# Patient Record
Sex: Female | Born: 1975 | Race: Black or African American | Hispanic: No | Marital: Married | Smoking: Never smoker
Health system: Southern US, Community
[De-identification: ages and names within clinical notes are randomized; demographics above are authoritative.]

## PROBLEM LIST (undated history)

## (undated) DIAGNOSIS — F5089 Other specified eating disorder: Secondary | ICD-10-CM

## (undated) DIAGNOSIS — D649 Anemia, unspecified: Secondary | ICD-10-CM

## (undated) DIAGNOSIS — D219 Benign neoplasm of connective and other soft tissue, unspecified: Secondary | ICD-10-CM

## (undated) DIAGNOSIS — F5083 Pica in adults: Secondary | ICD-10-CM

## (undated) DIAGNOSIS — F419 Anxiety disorder, unspecified: Secondary | ICD-10-CM

## (undated) DIAGNOSIS — Z5189 Encounter for other specified aftercare: Secondary | ICD-10-CM

---

## 2006-04-07 DIAGNOSIS — F419 Anxiety disorder, unspecified: Secondary | ICD-10-CM | POA: Insufficient documentation

## 2015-10-27 DIAGNOSIS — F5083 Pica in adults: Secondary | ICD-10-CM | POA: Insufficient documentation

## 2018-01-04 DIAGNOSIS — R1013 Epigastric pain: Secondary | ICD-10-CM | POA: Insufficient documentation

## 2018-03-26 DIAGNOSIS — Z8742 Personal history of other diseases of the female genital tract: Secondary | ICD-10-CM | POA: Insufficient documentation

## 2018-03-26 DIAGNOSIS — D251 Intramural leiomyoma of uterus: Secondary | ICD-10-CM | POA: Insufficient documentation

## 2020-06-01 ENCOUNTER — Emergency Department (HOSPITAL_COMMUNITY): Payer: BLUE CROSS/BLUE SHIELD

## 2020-06-01 ENCOUNTER — Encounter (HOSPITAL_COMMUNITY): Payer: Self-pay | Admitting: Emergency Medicine

## 2020-06-01 ENCOUNTER — Other Ambulatory Visit: Payer: Self-pay

## 2020-06-01 ENCOUNTER — Emergency Department (HOSPITAL_COMMUNITY)
Admission: EM | Admit: 2020-06-01 | Discharge: 2020-06-01 | Disposition: A | Discharge status: Home / Self Care | Payer: BLUE CROSS/BLUE SHIELD | Attending: Emergency Medicine | Admitting: Emergency Medicine

## 2020-06-01 DIAGNOSIS — Z20822 Contact with and (suspected) exposure to covid-19: Secondary | ICD-10-CM | POA: Insufficient documentation

## 2020-06-01 DIAGNOSIS — K5900 Constipation, unspecified: Secondary | ICD-10-CM | POA: Diagnosis not present

## 2020-06-01 DIAGNOSIS — N920 Excessive and frequent menstruation with regular cycle: Secondary | ICD-10-CM | POA: Diagnosis not present

## 2020-06-01 DIAGNOSIS — N946 Dysmenorrhea, unspecified: Secondary | ICD-10-CM | POA: Insufficient documentation

## 2020-06-01 DIAGNOSIS — D649 Anemia, unspecified: Secondary | ICD-10-CM | POA: Diagnosis not present

## 2020-06-01 DIAGNOSIS — Z5329 Procedure and treatment not carried out because of patient's decision for other reasons: Secondary | ICD-10-CM

## 2020-06-01 DIAGNOSIS — R42 Dizziness and giddiness: Secondary | ICD-10-CM | POA: Diagnosis present

## 2020-06-01 HISTORY — DX: Pica in adults: F50.83

## 2020-06-01 HISTORY — DX: Other specified eating disorder: F50.89

## 2020-06-01 HISTORY — DX: Anemia, unspecified: D64.9

## 2020-06-01 HISTORY — DX: Benign neoplasm of connective and other soft tissue, unspecified: D21.9

## 2020-06-01 LAB — HEMOGLOBIN AND HEMATOCRIT, BLOOD
HCT: 24.2 % — ABNORMAL LOW (ref 36.0–46.0)
Hemoglobin: 6.3 g/dL — CL (ref 12.0–15.0)

## 2020-06-01 LAB — CBC
HCT: 23.9 % — ABNORMAL LOW (ref 36.0–46.0)
Hemoglobin: 6.4 g/dL — CL (ref 12.0–15.0)
MCH: 18 pg — ABNORMAL LOW (ref 26.0–34.0)
MCHC: 26.8 g/dL — ABNORMAL LOW (ref 30.0–36.0)
MCV: 67.3 fL — ABNORMAL LOW (ref 80.0–100.0)
Platelets: 219 10*3/uL (ref 150–400)
RBC: 3.55 MIL/uL — ABNORMAL LOW (ref 3.87–5.11)
RDW: 20.5 % — ABNORMAL HIGH (ref 11.5–15.5)
WBC: 4.4 10*3/uL (ref 4.0–10.5)
nRBC: 0 % (ref 0.0–0.2)

## 2020-06-01 LAB — IRON AND TIBC
Iron: 16 ug/dL — ABNORMAL LOW (ref 28–170)
Saturation Ratios: 3 % — ABNORMAL LOW (ref 10.4–31.8)
TIBC: 496 ug/dL — ABNORMAL HIGH (ref 250–450)
UIBC: 480 ug/dL

## 2020-06-01 LAB — VITAMIN B12: Vitamin B-12: 547 pg/mL (ref 180–914)

## 2020-06-01 LAB — I-STAT BETA HCG BLOOD, ED (MC, WL, AP ONLY): I-stat hCG, quantitative: <5 m[IU]/mL (ref <5)

## 2020-06-01 LAB — FERRITIN: Ferritin: 2 ng/mL — ABNORMAL LOW (ref 11–307)

## 2020-06-01 LAB — COMPREHENSIVE METABOLIC PANEL
ALT: 11 U/L (ref 0–44)
AST: 12 U/L — ABNORMAL LOW (ref 15–41)
Albumin: 3.8 g/dL (ref 3.5–5.0)
Alkaline Phosphatase: 62 U/L (ref 38–126)
Anion gap: 8 (ref 5–15)
BUN: 11 mg/dL (ref 6–20)
CO2: 23 mmol/L (ref 22–32)
Calcium: 8.6 mg/dL — ABNORMAL LOW (ref 8.9–10.3)
Chloride: 107 mmol/L (ref 98–111)
Creatinine, Ser: 0.7 mg/dL (ref 0.44–1.00)
GFR, Estimated: >60 mL/min (ref >60)
Glucose, Bld: 103 mg/dL — ABNORMAL HIGH (ref 70–99)
Potassium: 3.7 mmol/L (ref 3.5–5.1)
Sodium: 138 mmol/L (ref 135–145)
Total Bilirubin: 0.4 mg/dL (ref 0.3–1.2)
Total Protein: 7.4 g/dL (ref 6.5–8.1)

## 2020-06-01 LAB — TYPE AND SCREEN
ABO/RH(D): O POS
Antibody Screen: NEGATIVE

## 2020-06-01 LAB — RETICULOCYTES
Immature Retic Fract: 17.8 % — ABNORMAL HIGH (ref 2.3–15.9)
RBC.: 3.56 MIL/uL — ABNORMAL LOW (ref 3.87–5.11)
Retic Count, Absolute: 38.1 10*3/uL (ref 19.0–186.0)
Retic Ct Pct: 1.1 % (ref 0.4–3.1)

## 2020-06-01 LAB — TROPONIN I (HIGH SENSITIVITY)
Troponin I (High Sensitivity): <2 ng/L (ref <18)
Troponin I (High Sensitivity): <2 ng/L (ref <18)

## 2020-06-01 LAB — LIPASE, BLOOD: Lipase: 30 U/L (ref 11–51)

## 2020-06-01 LAB — FOLATE: Folate: 6.2 ng/mL (ref >5.9)

## 2020-06-01 LAB — RESP PANEL BY RT-PCR (FLU A&B, COVID) ARPGX2
Influenza A by PCR: NEGATIVE
Influenza B by PCR: NEGATIVE
SARS Coronavirus 2 by RT PCR: NEGATIVE

## 2020-06-01 LAB — POC OCCULT BLOOD, ED: Fecal Occult Bld: POSITIVE — AB

## 2020-06-01 LAB — ABO/RH: ABO/RH(D): O POS

## 2020-06-01 MED ORDER — SODIUM CHLORIDE 0.9 % IV BOLUS
1000.0000 mL | Freq: Once | INTRAVENOUS | Status: AC
  Administered 2020-06-01: 1000 mL via INTRAVENOUS

## 2020-06-01 MED ORDER — ALUM & MAG HYDROXIDE-SIMETH 200-200-20 MG/5ML PO SUSP
30.0000 mL | Freq: Once | ORAL | Status: AC
  Administered 2020-06-01: 30 mL via ORAL
  Filled 2020-06-01: qty 30

## 2020-06-01 NOTE — ED Notes (Signed)
Patient educated on her need for a blood transfusion, she still does not want to stay.  Patient states her hgb is always low and she will be fine.  Daughter also at bedside attempting to get her mother to stay and she still wants to leave.  Patient's IV removed at her request and she signed AMA paper.

## 2020-06-01 NOTE — ED Notes (Signed)
Patient has pulled off BP cuff and pulse ox cord.  Stating she wants to leave and does not want to stay for blood.  Benjamine Mola, [place] made aware.

## 2020-06-01 NOTE — ED Notes (Signed)
Critical lab result: Hgb 6.3; notified Andee Poles, RN and [place], [place]

## 2020-06-01 NOTE — Discharge Instructions (Addendum)
Today you left against medical advice.  You left before I could give you a referral for hematology.

## 2020-06-01 NOTE — ED Provider Notes (Signed)
[facility] DEPT Provider Note   CSN: [id number] Arrival date & time: 06/01/20  1256     History No chief complaint on file.   Angel Munoz is a 44 y.o. female the past medical history of anemia, anxiety, fibroids, pica, who presents today for evaluation of pains since Saturday.  She reports that since Saturday she has had mid abdominal pain that radiates up into her chest.  She also endorses that since yesterday into today she has been lightheaded and dizzy especially with position changes.  She denies any loss of consciousness or falls.  No nausea vomiting or diarrhea however she does have constipation.  She denies significant cough or shortness of breath.  No leg swelling, hemoptysis, recent surgeries or immobilizations.  No personal history of DVT/PE.  She denies any fevers.  No recent sickness.  She states that when she was in [place] she used to get iron infusions and has never required blood transfusion before however has not recently had iron transfusion.  HPI     Past Medical History:  Diagnosis Date  . Anemia   . Fibroid   . Pica in adults     There are no problems to display for this patient.   History reviewed. No pertinent surgical history.   OB History   No obstetric history on file.     No family history on file.  Social History   Tobacco Use  . Smoking status: Never Smoker  . Smokeless tobacco: Never Used  Substance Use Topics  . Alcohol use: Not Currently  . Drug use: Not Currently    Home Medications Prior to Admission medications   Medication Sig Start Date End Date Taking? Authorizing Provider  albuterol (VENTOLIN HFA) 108 (90 Base) MCG/ACT inhaler Inhale 1 puff into the lungs every 6 (six) hours as needed for wheezing or shortness of breath.  05/07/20  Yes [provider]  omeprazole (PRILOSEC) 20 MG capsule Take 20 mg by mouth daily as needed (acid reflux).  05/07/20  Yes [provider]    Allergies    Iron dextran  Review of Systems   Review of Systems  Constitutional: Positive for fatigue.  Respiratory: Negative for cough, chest tightness and shortness of breath.   Cardiovascular: Positive for chest pain.  Gastrointestinal: Positive for abdominal pain and constipation. Negative for blood in stool and vomiting.  Genitourinary: Negative for difficulty urinating and flank pain.  Musculoskeletal: Negative for back pain.  Skin: Negative for rash.  Neurological: Positive for light-headedness. Negative for syncope and weakness.  Psychiatric/Behavioral: Negative for confusion.  All other systems reviewed and are negative.   Physical Exam Updated Vital Signs BP 122/70   Pulse 79   Temp 98.1 F (36.7 C)   Resp (!) 21   Ht 5\' 7"  (1.702 m)   Wt 81.6 kg   LMP 06/01/2020   SpO2 100%   BMI 28.19 kg/m   Physical Exam Vitals and nursing note reviewed. Exam conducted with a chaperone present.  Constitutional:      General: She is not in acute distress.    Appearance: She is well-developed. She is not diaphoretic.  HENT:     Head: Normocephalic and atraumatic.  Eyes:     General: No scleral icterus.       Right eye: No discharge.        Left eye: No discharge.  Cardiovascular:     Rate and Rhythm: Normal rate and regular rhythm.  Heart sounds: Normal heart sounds. No murmur heard.   Pulmonary:     Effort: Pulmonary effort is normal. No respiratory distress.     Breath sounds: Normal breath sounds. No stridor.  Abdominal:     General: Abdomen is flat. There is no distension.     Palpations: Abdomen is soft.     Tenderness: There is abdominal tenderness (Mild, epigastric). There is no right CVA tenderness, left CVA tenderness or guarding.  Genitourinary:    Rectum: Guaiac result positive (No stool in rectal vault.  Due to menstrual cycle the rectum was cleaned with cloth to remove menstrual blood from contaminating sample. ).  Musculoskeletal:         General: No deformity.     Cervical back: Normal range of motion and neck supple.  Skin:    General: Skin is warm and dry.  Neurological:     General: No focal deficit present.     Mental Status: She is alert and oriented to person, place, and time.     Motor: No abnormal muscle tone.  Psychiatric:        Mood and Affect: Mood normal.        Behavior: Behavior normal.     ED Results / Procedures / Treatments   Labs (all labs ordered are listed, but only abnormal results are displayed) Labs Reviewed  COMPREHENSIVE METABOLIC PANEL - Abnormal; Notable for the following components:      Result Value   Glucose, Bld 103 (*)    Calcium 8.6 (*)    AST 12 (*)    All other components within normal limits  CBC - Abnormal; Notable for the following components:   RBC 3.55 (*)    Hemoglobin 6.4 (*)    HCT 23.9 (*)    MCV 67.3 (*)    MCH 18.0 (*)    MCHC 26.8 (*)    RDW 20.5 (*)    All other components within normal limits  IRON AND TIBC - Abnormal; Notable for the following components:   Iron 16 (*)    TIBC 496 (*)    Saturation Ratios 3 (*)    All other components within normal limits  FERRITIN - Abnormal; Notable for the following components:   Ferritin 2 (*)    All other components within normal limits  RETICULOCYTES - Abnormal; Notable for the following components:   RBC. 3.56 (*)    Immature Retic Fract 17.8 (*)    All other components within normal limits  HEMOGLOBIN AND HEMATOCRIT, BLOOD - Abnormal; Notable for the following components:   Hemoglobin 6.3 (*)    HCT 24.2 (*)    All other components within normal limits  POC OCCULT BLOOD, ED - Abnormal; Notable for the following components:   Fecal Occult Bld POSITIVE (*)    All other components within normal limits  RESP PANEL BY RT-PCR (FLU A&B, COVID) ARPGX2  LIPASE, BLOOD  VITAMIN B12  FOLATE  URINALYSIS, ROUTINE W REFLEX MICROSCOPIC  I-STAT BETA HCG BLOOD, ED ([facility], [facility], [facility] ONLY)  TYPE AND SCREEN  ABO/RH  TROPONIN  I (HIGH SENSITIVITY)  TROPONIN I (HIGH SENSITIVITY)    EKG None  Radiology DG Chest 2 View  Result Date: 06/01/2020 CLINICAL DATA:  Chest pain. EXAM: CHEST - 2 VIEW COMPARISON:  None. FINDINGS: The heart size and mediastinal contours are within normal limits. Both lungs are clear. No visible pleural effusions or pneumothorax. The visualized skeletal structures are unremarkable. IMPRESSION: No active cardiopulmonary disease.  Electronically Signed   By: Margaretha Sheffield MD   On: 06/01/2020 14:15    Procedures Procedures (including critical care time)  Medications Ordered in ED Medications  sodium chloride 0.9 % bolus 1,000 mL (0 mLs Intravenous Stopped 06/01/20 1749)  alum & mag hydroxide-simeth (MAALOX/MYLANTA) 200-200-20 MG/5ML suspension 30 mL (30 mLs Oral Given 06/01/20 1748)    ED Course  I have reviewed the triage vital signs and the nursing notes.  Pertinent labs & imaging results that were available during my care of the patient were reviewed by me and considered in my medical decision making (see chart for details).  Clinical Course as of Jun 01 2233  Mon Jun 01, 2020  1620 Repeat H and H confirms anemia.  Patient at this time feels like her symptoms of being dizzy are due to her being dehydrated, not to her hemoglobin of 6.3.  She does not wish for blood transfusion at this time.  She wishes for trial of IV fluids.  We discussed that this will not fix her anemia and may take it temporarily appear worse.  She states her understanding.  She also states that she will not, under any circumstances, be admitted into the hospital.   [EH]    Clinical Course User Index [EH] Ollen Gross   MDM Rules/Calculators/A&P                         Yulisa Chirico is a 44 year old woman with a past medical history of anemia who presents today for evaluation of multiple complaints. She has had abdominal pain radiating into her chest with lightheaded and dizziness. She  is chronically constipated. She has chronic abdominal pain. Chart review from records from [place] show she has had multiple imaging for what appears to be similar events. She used to get iron infusions however has not had any recently. Here today she is anemic. Initial hemoglobin is 6.4, repeat confirmation is 6.3. I recommended blood transfusion. I discussed with patient that while iron infusions can help increase her hemoglobin over time that with her symptoms and the degree of her anemia I would recommend blood transfusion rather than attempting iron transfusions. Anemia panel is sent. She does not have significant leukocytosis and her CMP is unremarkable, lipase is not elevated and pregnancy test is negative. She does report heavy menstrual cycles which may be, when combined with her iron deficiency a cause of her worsening anemia she also had epigastric abdominal pain. She has previously been seen by GI however does not have a local GI currently. Occult blood was performed which was positive raising concern for a possibly bleeding gastric ulcer.  I additionally recommended that patient be admitted to the hospital given her Hemoccult positive and anemia for GI evaluation. Patient is adamant that she will not be staying in the hospital and will not be admitted.  Anemia panel shows low iron at 16, reticulocytes are elevated at 17.8 with low ferritin. Folate is normal. Troponin x2 is negative, EKG does not show ischemia, she is PERC negative and chest x-ray is reassuring.  Patient refused blood transfusion stating she believed her symptoms are due to dehydration which she states is related to her constipation. She wished to try treatment with IV fluids and then depending on how she felt be reevaluated for blood transfusion. I discussed that this would create a delusional effect worsening her anemia and possibly worsening her symptoms. We discussed risks of  untreated anemia including organ damage, syncope with  resulting trauma, disability, and side effects up to and including death.   Patient chose to leave. She did this and did not give me adequate time to see her again and reiterate risks of her decision.   Patient signed AMA papers according to RN. RN note additionally reports they educated patient on need for blood transfusion and patient still chose to leave.  Patient appears to have the ability to make her own medical decisions at this time.   Note: Portions of this report may have been transcribed using voice recognition software. Every effort was made to ensure accuracy; however, inadvertent computerized transcription errors may be present  Final Clinical Impression(s) / ED Diagnoses Final diagnoses:  Anemia, unspecified type  Left against medical advice  Menorrhagia with regular cycle    Rx / DC Orders ED Discharge Orders    None       Ollen Gross 06/01/20 2234    Lajean Saver, MD 06/02/20 [phone]

## 2020-06-01 NOTE — ED Triage Notes (Signed)
Patient states that she has intermittent mid-back pain that moves to her abdomen, and up to her chest since Saturday. Endorses light headness and dizziness, denies LOC or falls. Denies N/V/D, endorses mild constipation. Denies cough.

## 2020-06-23 ENCOUNTER — Ambulatory Visit: Payer: BLUE CROSS/BLUE SHIELD | Admitting: Nurse Practitioner

## 2020-06-23 ENCOUNTER — Ambulatory Visit (HOSPITAL_COMMUNITY): Admit: 2020-06-23 | Discharge status: Against Medical Advice | Payer: BLUE CROSS/BLUE SHIELD

## 2020-06-23 NOTE — Progress Notes (Deleted)
06/23/2020 Angel Munoz [id number] Jun 14, 1976   CHIEF COMPLAINT:   HISTORY OF PRESENT ILLNESS:  Angel Munoz is a 44 year old female with a past medical history of anemia, uterine fibroids   She presented to the ED on 06/01/2020 with indigestion described as pain from the xyphoid which radiated up into her chest. Mid abdominal pain. Light headedness and dizziness. She declined a blood transfusion and hospital admission.    She states that when she was in [place] she used to get iron infusions and has never required blood transfusion before however has not recently had iron transfusion.  CBC Latest Ref Rng & Units 06/01/2020 06/01/2020  WBC 4.0 - 10.5 K/uL - 4.4  Hemoglobin 12.0 - 15.0 g/dL 6.3(LL) 6.4(LL)  Hematocrit 36.0 - 46.0 % 24.2(L) 23.9(L)  Platelets 150 - 400 K/uL - 219    Iron/TIBC/Ferritin/ %Sat    Component Value Date/Time   IRON 16 (L) 06/01/2020 1503   TIBC 496 (H) 06/01/2020 1503   FERRITIN 2 (L) 06/01/2020 1503   IRONPCTSAT 3 (L) 06/01/2020 1503   CMP Latest Ref Rng & Units 06/01/2020  Glucose 70 - 99 mg/dL 103(H)  BUN 6 - 20 mg/dL 11  Creatinine 0.44 - 1.00 mg/dL 0.70  Sodium 135 - 145 mmol/L 138  Potassium 3.5 - 5.1 mmol/L 3.7  Chloride 98 - 111 mmol/L 107  CO2 22 - 32 mmol/L 23  Calcium 8.9 - 10.3 mg/dL 8.6(L)  Total Protein 6.5 - 8.1 g/dL 7.4  Total Bilirubin 0.3 - 1.2 mg/dL 0.4  Alkaline Phos 38 - 126 U/L 62  AST 15 - 41 U/L 12(L)  ALT 0 - 44 U/L 11   Past Medical History:  Diagnosis Date  . Anemia   . Anemia   . Fibroid   . Pica in adults    Social History:  Family History:    No past surgical history on file.  reports that she has never smoked. She has never used smokeless tobacco. She reports previous alcohol use. She reports previous drug use. family history is not on file. Allergies  Allergen Reactions  . Iron Dextran     Other reaction(s): Musculoskeletal Pain Fever, cough      Outpatient Encounter  Medications as of 06/23/2020  Medication Sig  . albuterol (VENTOLIN HFA) 108 (90 Base) MCG/ACT inhaler Inhale 1 puff into the lungs every 6 (six) hours as needed for wheezing or shortness of breath.   Marland Kitchen omeprazole (PRILOSEC) 20 MG capsule Take 20 mg by mouth daily as needed (acid reflux).    No facility-administered encounter medications on file as of 06/23/2020.     REVIEW OF SYSTEMS:  Gen: Denies fever, sweats or chills. No weight loss.  CV: Denies chest pain, palpitations or edema. Resp: Denies cough, shortness of breath of hemoptysis.  GI: Denies heartburn, dysphagia, stomach or lower abdominal pain. No diarrhea or constipation.  GU : Denies urinary burning, blood in urine, increased urinary frequency or incontinence. MS: Denies joint pain, muscles aches or weakness. Derm: Denies rash, itchiness, skin lesions or unhealing ulcers. Psych: Denies depression, anxiety, memory loss, suicidal ideation and confusion. Heme: Denies bruising, bleeding. Neuro:  Denies headaches, dizziness or paresthesias. Endo:  Denies any problems with DM, thyroid or adrenal function.    PHYSICAL EXAM: LMP 06/01/2020  General: Well developed ... in no acute distress. Head: Normocephalic and atraumatic. Eyes:  Sclerae non-icteric, conjunctive pink. Ears: Normal auditory acuity. Mouth: Dentition intact. No ulcers or lesions.  Neck: Supple, no lymphadenopathy or thyromegaly.  Lungs: Clear bilaterally to auscultation without wheezes, crackles or rhonchi. Heart: Regular rate and rhythm. No murmur, rub or gallop appreciated.  Abdomen: Soft, nontender, non distended. No masses. No hepatosplenomegaly. Normoactive bowel sounds x 4 quadrants.  Rectal:  Musculoskeletal: Symmetrical with no gross deformities. Skin: Warm and dry. No rash or lesions on visible extremities. Extremities: No edema. Neurological: Alert oriented x 4, no focal deficits.  Psychological:  Alert and cooperative. Normal mood and  affect.  ASSESSMENT AND PLAN:    CC:  No ref. provider found

## 2020-06-24 ENCOUNTER — Other Ambulatory Visit: Payer: Self-pay

## 2020-06-24 ENCOUNTER — Emergency Department (HOSPITAL_BASED_OUTPATIENT_CLINIC_OR_DEPARTMENT_OTHER)
Admission: EM | Admit: 2020-06-24 | Discharge: 2020-06-24 | Disposition: A | Discharge status: Home / Self Care | Payer: BLUE CROSS/BLUE SHIELD | Attending: Emergency Medicine | Admitting: Emergency Medicine

## 2020-06-24 ENCOUNTER — Encounter (HOSPITAL_BASED_OUTPATIENT_CLINIC_OR_DEPARTMENT_OTHER): Payer: Self-pay | Admitting: Emergency Medicine

## 2020-06-24 DIAGNOSIS — R5383 Other fatigue: Secondary | ICD-10-CM | POA: Insufficient documentation

## 2020-06-24 LAB — CBC WITH DIFFERENTIAL/PLATELET
Abs Immature Granulocytes: 0 10*3/uL (ref 0.00–0.07)
Basophils Absolute: 0 10*3/uL (ref 0.0–0.1)
Basophils Relative: 0 %
Eosinophils Absolute: 0 10*3/uL (ref 0.0–0.5)
Eosinophils Relative: 0 %
HCT: 37.9 % (ref 36.0–46.0)
Hemoglobin: 11.1 g/dL — ABNORMAL LOW (ref 12.0–15.0)
Lymphocytes Relative: 37 %
Lymphs Abs: 1.9 10*3/uL (ref 0.7–4.0)
MCH: 22.4 pg — ABNORMAL LOW (ref 26.0–34.0)
MCHC: 29.3 g/dL — ABNORMAL LOW (ref 30.0–36.0)
MCV: 76.6 fL — ABNORMAL LOW (ref 80.0–100.0)
Monocytes Absolute: 0.4 10*3/uL (ref 0.1–1.0)
Monocytes Relative: 7 %
Neutro Abs: 2.8 10*3/uL (ref 1.7–7.7)
Neutrophils Relative %: 56 %
Platelets: 278 10*3/uL (ref 150–400)
RBC: 4.95 MIL/uL (ref 3.87–5.11)
RDW: 29.1 % — ABNORMAL HIGH (ref 11.5–15.5)
WBC: 5 10*3/uL (ref 4.0–10.5)
nRBC: 0 % (ref 0.0–0.2)

## 2020-06-24 LAB — BASIC METABOLIC PANEL
Anion gap: 9 (ref 5–15)
BUN: 10 mg/dL (ref 6–20)
CO2: 23 mmol/L (ref 22–32)
Calcium: 9.4 mg/dL (ref 8.9–10.3)
Chloride: 103 mmol/L (ref 98–111)
Creatinine, Ser: 0.75 mg/dL (ref 0.44–1.00)
GFR, Estimated: >60 mL/min (ref >60)
Glucose, Bld: 94 mg/dL (ref 70–99)
Potassium: 3.4 mmol/L — ABNORMAL LOW (ref 3.5–5.1)
Sodium: 135 mmol/L (ref 135–145)

## 2020-06-24 NOTE — Discharge Instructions (Addendum)
Please make sure to establish with a primary care doctor and OB/GYN.  Return to the ER for any new or worsening symptoms.

## 2020-06-24 NOTE — ED Provider Notes (Signed)
[facility] Provider Note   CSN: [id number] Arrival date & time: 06/24/20  1402     History Chief Complaint  Patient presents with   Anemia    Amarilis Ismael Treptow is a 44 y.o. female.  HPI 44 year old female with history of anemia, fibroids, pica presents to the ER requesting her hemoglobin be checked as she has been feeling more tired and winded over the last 2 days.  Has a history of anemia secondary to heavy periods.  States that she was seen here several weeks ago and she had a hemoglobin of 6, was told that she needs a transfusion, however she left [place] as she was traveling to [place] the next week and generally gets her transfusions done there.  She states that she did get these transfusions done in [place], was also put on some OCPs.  Denies any fevers, chills, cough, current vaginal bleeding or discharge, abdominal pain, nasal congestion or any other infectious symptoms.    Past Medical History:  Diagnosis Date   Anemia    Anemia    Fibroid    Pica in adults     There are no problems to display for this patient.   History reviewed. No pertinent surgical history.   OB History   No obstetric history on file.     No family history on file.  Social History   Tobacco Use   Smoking status: Never Smoker   Smokeless tobacco: Never Used  Substance Use Topics   Alcohol use: Not Currently   Drug use: Not Currently    Home Medications Prior to Admission medications   Medication Sig Start Date End Date Taking? Authorizing Provider  albuterol (VENTOLIN HFA) 108 (90 Base) MCG/ACT inhaler Inhale 1 puff into the lungs every 6 (six) hours as needed for wheezing or shortness of breath.  05/07/20   [provider]  omeprazole (PRILOSEC) 20 MG capsule Take 20 mg by mouth daily as needed (acid reflux).  05/07/20   [provider]    Allergies    Iron dextran  Review of Systems   Review of  Systems  Constitutional: Positive for fatigue. Negative for chills and fever.  HENT: Negative for ear pain and sore throat.   Eyes: Negative for pain and visual disturbance.  Respiratory: Negative for cough and shortness of breath.   Cardiovascular: Negative for chest pain and palpitations.  Gastrointestinal: Negative for abdominal pain and vomiting.  Genitourinary: Negative for dysuria and hematuria.  Musculoskeletal: Negative for arthralgias and back pain.  Skin: Negative for color change and rash.  Neurological: Negative for seizures and syncope.  All other systems reviewed and are negative.   Physical Exam Updated Vital Signs BP 122/87 (BP Location: Left Arm)    Pulse 66    Temp 98.8 F (37.1 C) (Oral)    LMP 06/01/2020    SpO2 97%   Physical Exam Vitals and nursing note reviewed.  Constitutional:      General: She is not in acute distress.    Appearance: She is well-developed and well-nourished.  HENT:     Head: Normocephalic and atraumatic.  Eyes:     Conjunctiva/sclera: Conjunctivae normal.  Cardiovascular:     Rate and Rhythm: Normal rate and regular rhythm.     Heart sounds: No murmur heard.   Pulmonary:     Effort: Pulmonary effort is normal. No respiratory distress.     Breath sounds: Normal breath sounds.  Abdominal:  Palpations: Abdomen is soft.     Tenderness: There is no abdominal tenderness.  Musculoskeletal:        General: No edema.     Cervical back: Neck supple.  Skin:    General: Skin is warm and dry.  Neurological:     Mental Status: She is alert.  Psychiatric:        Mood and Affect: Mood and affect normal.     ED Results / Procedures / Treatments   Labs (all labs ordered are listed, but only abnormal results are displayed) Labs Reviewed  CBC WITH DIFFERENTIAL/PLATELET - Abnormal; Notable for the following components:      Result Value   Hemoglobin 11.1 (*)    MCV 76.6 (*)    MCH 22.4 (*)    MCHC 29.3 (*)    RDW 29.1 (*)    All  other components within normal limits  BASIC METABOLIC PANEL - Abnormal; Notable for the following components:   Potassium 3.4 (*)    All other components within normal limits    EKG None  Radiology No results found.  Procedures Procedures (including critical care time)  Medications Ordered in ED Medications - No data to display  ED Course  I have reviewed the triage vital signs and the nursing notes.  Pertinent labs & imaging results that were available during my care of the patient were reviewed by me and considered in my medical decision making (see chart for details).    MDM Rules/Calculators/A&P                         44 year old female complaining of fatigue.  Not currently bleeding, no vaginal complaints, do not think there is a need for pelvic exam right now.  Her CBC shows a hemoglobin of 11.1 today.  She denies any other infectious symptoms.  Low suspicion for infection has been a cause of her symptoms.  BMP with mild hypokalemia but no other significant findings.  She thinks that her fatigue is probably due to her sleep schedule.  Patient was encouraged to take over-the-counter potassium supplements.  She does not have an OB/GYN or PCP in the area, however states she is recently insured and is a process of looking one.  I directed her to the phone number in her discharge paperwork in order to establish with 1.  Return precautions discussed.  She voiced understanding and is agreeable.  Final Clinical Impression(s) / ED Diagnoses Final diagnoses:  Fatigue, unspecified type    Rx / DC Orders ED Discharge Orders    None       Garald Balding, PA-C 06/24/20 [place], [place], DO 06/25/20 [phone]

## 2020-06-24 NOTE — ED Triage Notes (Signed)
States she has anemia and that she was seen at [facility] before Thx and they wanted to admit her then but she was going to [place] and was transfused there instead , now she is feel ing fatiqued again and wants to see if her HGB is low again states just moved here 4 months ago

## 2020-08-28 DIAGNOSIS — N920 Excessive and frequent menstruation with regular cycle: Secondary | ICD-10-CM | POA: Insufficient documentation

## 2020-09-05 ENCOUNTER — Other Ambulatory Visit: Payer: Self-pay

## 2020-09-05 ENCOUNTER — Emergency Department (HOSPITAL_BASED_OUTPATIENT_CLINIC_OR_DEPARTMENT_OTHER): Payer: 59

## 2020-09-05 ENCOUNTER — Encounter (HOSPITAL_BASED_OUTPATIENT_CLINIC_OR_DEPARTMENT_OTHER): Payer: Self-pay | Admitting: *Deleted

## 2020-09-05 ENCOUNTER — Emergency Department (HOSPITAL_BASED_OUTPATIENT_CLINIC_OR_DEPARTMENT_OTHER)
Admission: EM | Admit: 2020-09-05 | Discharge: 2020-09-05 | Disposition: A | Discharge status: Home / Self Care | Payer: 59 | Attending: Emergency Medicine | Admitting: Emergency Medicine

## 2020-09-05 DIAGNOSIS — D259 Leiomyoma of uterus, unspecified: Secondary | ICD-10-CM | POA: Diagnosis not present

## 2020-09-05 DIAGNOSIS — N281 Cyst of kidney, acquired: Secondary | ICD-10-CM | POA: Insufficient documentation

## 2020-09-05 DIAGNOSIS — K5900 Constipation, unspecified: Secondary | ICD-10-CM | POA: Diagnosis not present

## 2020-09-05 DIAGNOSIS — N83202 Unspecified ovarian cyst, left side: Secondary | ICD-10-CM | POA: Insufficient documentation

## 2020-09-05 DIAGNOSIS — R1032 Left lower quadrant pain: Secondary | ICD-10-CM

## 2020-09-05 DIAGNOSIS — K7689 Other specified diseases of liver: Secondary | ICD-10-CM | POA: Diagnosis not present

## 2020-09-05 HISTORY — DX: Encounter for other specified aftercare: Z51.89

## 2020-09-05 LAB — COMPREHENSIVE METABOLIC PANEL
ALT: 11 U/L (ref 0–44)
AST: 15 U/L (ref 15–41)
Albumin: 3.5 g/dL (ref 3.5–5.0)
Alkaline Phosphatase: 49 U/L (ref 38–126)
Anion gap: 7 (ref 5–15)
BUN: 13 mg/dL (ref 6–20)
CO2: 22 mmol/L (ref 22–32)
Calcium: 8.8 mg/dL — ABNORMAL LOW (ref 8.9–10.3)
Chloride: 107 mmol/L (ref 98–111)
Creatinine, Ser: 0.85 mg/dL (ref 0.44–1.00)
GFR, Estimated: >60 mL/min (ref >60)
Glucose, Bld: 103 mg/dL — ABNORMAL HIGH (ref 70–99)
Potassium: 3.2 mmol/L — ABNORMAL LOW (ref 3.5–5.1)
Sodium: 136 mmol/L (ref 135–145)
Total Bilirubin: 0.1 mg/dL — ABNORMAL LOW (ref 0.3–1.2)
Total Protein: 6.9 g/dL (ref 6.5–8.1)

## 2020-09-05 LAB — CBC
HCT: 30.5 % — ABNORMAL LOW (ref 36.0–46.0)
Hemoglobin: 9.4 g/dL — ABNORMAL LOW (ref 12.0–15.0)
MCH: 26.9 pg (ref 26.0–34.0)
MCHC: 30.8 g/dL (ref 30.0–36.0)
MCV: 87.1 fL (ref 80.0–100.0)
Platelets: 237 10*3/uL (ref 150–400)
RBC: 3.5 MIL/uL — ABNORMAL LOW (ref 3.87–5.11)
RDW: 17.2 % — ABNORMAL HIGH (ref 11.5–15.5)
WBC: 4.7 10*3/uL (ref 4.0–10.5)
nRBC: 0 % (ref 0.0–0.2)

## 2020-09-05 LAB — URINALYSIS, ROUTINE W REFLEX MICROSCOPIC
Bilirubin Urine: NEGATIVE
Glucose, UA: NEGATIVE mg/dL
Hgb urine dipstick: NEGATIVE
Ketones, ur: NEGATIVE mg/dL
Leukocytes,Ua: NEGATIVE
Nitrite: NEGATIVE
Protein, ur: NEGATIVE mg/dL
Specific Gravity, Urine: 1.015 (ref 1.005–1.030)
pH: 6.5 (ref 5.0–8.0)

## 2020-09-05 LAB — LIPASE, BLOOD: Lipase: 32 U/L (ref 11–51)

## 2020-09-05 LAB — PREGNANCY, URINE: Preg Test, Ur: NEGATIVE

## 2020-09-05 MED ORDER — SODIUM CHLORIDE 0.9 % IV BOLUS
1000.0000 mL | Freq: Once | INTRAVENOUS | Status: AC
  Administered 2020-09-05: 1000 mL via INTRAVENOUS

## 2020-09-05 MED ORDER — POTASSIUM CHLORIDE CRYS ER 20 MEQ PO TBCR
40.0000 meq | EXTENDED_RELEASE_TABLET | Freq: Once | ORAL | Status: AC
  Administered 2020-09-05: 40 meq via ORAL
  Filled 2020-09-05: qty 2

## 2020-09-05 MED ORDER — IOHEXOL 300 MG/ML  SOLN
100.0000 mL | Freq: Once | INTRAMUSCULAR | Status: AC | PRN
  Administered 2020-09-05: 100 mL via INTRAVENOUS

## 2020-09-05 NOTE — Discharge Instructions (Signed)
Please read and follow all provided instructions.  Your diagnoses today include:  1. Left ovarian cyst   2. Uterine leiomyoma, unspecified location   3. LLQ abdominal pain   4. Hepatic calcification   5. Renal cyst     Tests performed today include:  Blood cell counts and platelets - hemoglobin was 9.4  Kidney and liver function tests  Pancreas function test (called lipase)  Urine test to look for infection  A blood or urine test for pregnancy (women only)  CT scan of your abdomen and pelvis - showed ovarian cyst and fibroids, calcification in the liver and kidney cyst  Vital signs. See below for your results today.   Medications prescribed:  Please use over-the-counter NSAID medications (ibuprofen, naproxen) as directed on the packaging for pain if you do not have any reasons not to take these medications just as weak kidneys or a history of bleeding in your stomach or gut.   Take any prescribed medications only as directed.  Home care instructions:   Follow any educational materials contained in this packet.  Follow-up instructions: Please follow-up with your GYN as planned for further evaluation of your symptoms.  Please let your primary care doctor know about the calcification in the liver and your renal cyst.   Return instructions:  SEEK IMMEDIATE MEDICAL ATTENTION IF:  The pain does not go away or becomes severe   A temperature above 101F develops   Repeated vomiting occurs (multiple episodes)   The pain becomes localized to portions of the abdomen. The right side could possibly be appendicitis. In an adult, the left lower portion of the abdomen could be colitis or diverticulitis.   Blood is being passed in stools or vomit (bright red or black tarry stools)   You develop chest pain, difficulty breathing, dizziness or fainting, or become confused, poorly responsive, or inconsolable (young children)  If you have any other emergent concerns regarding your  health  Additional Information: Abdominal (belly) pain can be caused by many things. Your caregiver performed an examination and possibly ordered blood/urine tests and imaging (CT scan, x-rays, ultrasound). Many cases can be observed and treated at home after initial evaluation in the emergency department. Even though you are being discharged home, abdominal pain can be unpredictable. Therefore, you need a repeated exam if your pain does not resolve, returns, or worsens. Most patients with abdominal pain don't have to be admitted to the hospital or have surgery, but serious problems like appendicitis and gallbladder attacks can start out as nonspecific pain. Many abdominal conditions cannot be diagnosed in one visit, so follow-up evaluations are very important.  Your vital signs today were: BP 100/63 (BP Location: Right Arm)   Pulse 75   Temp 98.3 F (36.8 C) (Oral)   Resp 16   Ht 5\' 7"  (1.702 m)   Wt 86.2 kg   LMP 08/16/2020   SpO2 100%   BMI 29.76 kg/m  If your blood pressure (bp) was elevated above 135/85 this visit, please have this repeated by your doctor within one month. --------------

## 2020-09-05 NOTE — ED Provider Notes (Signed)
[facility]PART[facility] Provider Note   CSN: [id number] Arrival date & time: 09/05/20  1725     History Chief Complaint  Patient presents with  . Abdominal Pain    Angel Munoz is a 45 y.o. female.  Patient with history of GERD on PPI, presents to the emergency department for evaluation of abdominal pain and constipation.  Symptoms have been ongoing over the past 4 days.  She has had abdominal pain in the left lateral abdomen and left lower quadrants.  She denies nausea or vomiting.  No fever, chest pain or shortness of breath.  No previous abdominal surgeries.  She has tried Metamucil, prune juice, increased fluids and Colace for constipation.  She took an enema this morning which helped somewhat.  She denies urinary symptoms.  She states he was recently treated for yeast infection but denies vaginal bleeding. The onset of this condition was gradual. The course is constant. Aggravating factors: none.  She states that she feels dehydrated and in her mouth is dry.        Past Medical History:  Diagnosis Date  . Anemia   . Anemia   . Blood transfusion without reported diagnosis   . Fibroid   . Pica in adults     There are no problems to display for this patient.   History reviewed. No pertinent surgical history.   OB History   No obstetric history on file.     No family history on file.  Social History   Tobacco Use  . Smoking status: Never Smoker  . Smokeless tobacco: Never Used  Substance Use Topics  . Alcohol use: Not Currently  . Drug use: Not Currently    Home Medications Prior to Admission medications   Medication Sig Start Date End Date Taking? Authorizing Provider  albuterol (VENTOLIN HFA) 108 (90 Base) MCG/ACT inhaler Inhale 1 puff into the lungs every 6 (six) hours as needed for wheezing or shortness of breath.  05/07/20   [provider]  omeprazole (PRILOSEC) 20 MG capsule Take 20 mg by mouth daily as needed  (acid reflux).  05/07/20   [provider]    Allergies    Iron dextran  Review of Systems   Review of Systems  Constitutional: Negative for fever.  HENT: Negative for rhinorrhea and sore throat.   Eyes: Negative for redness.  Respiratory: Negative for cough.   Cardiovascular: Negative for chest pain.  Gastrointestinal: Positive for abdominal pain and constipation. Negative for diarrhea, nausea and vomiting.  Genitourinary: Negative for dysuria, frequency, hematuria and urgency.  Musculoskeletal: Negative for myalgias.  Skin: Negative for rash.  Neurological: Negative for headaches.    Physical Exam Updated Vital Signs BP 103/76 (BP Location: Right Arm)   Pulse 89   Temp 98.3 F (36.8 C) (Oral)   Resp 16   Ht 5\' 7"  (1.702 m)   Wt 86.2 kg   LMP 08/16/2020   SpO2 100%   BMI 29.76 kg/m   Physical Exam Vitals and nursing note reviewed.  Constitutional:      General: She is not in acute distress.    Appearance: She is well-developed.  HENT:     Head: Normocephalic and atraumatic.     Right Ear: External ear normal.     Left Ear: External ear normal.     Nose: Nose normal.  Eyes:     Conjunctiva/sclera: Conjunctivae normal.  Cardiovascular:     Rate and Rhythm: Normal rate and regular rhythm.  Heart sounds: No murmur heard.   Pulmonary:     Effort: No respiratory distress.     Breath sounds: No wheezing, rhonchi or rales.  Abdominal:     Palpations: Abdomen is soft.     Tenderness: There is abdominal tenderness (mild) in the left lower quadrant. There is no guarding or rebound. Negative signs include Murphy's sign and McBurney's sign.  Musculoskeletal:     Cervical back: Normal range of motion and neck supple.     Right lower leg: No edema.     Left lower leg: No edema.  Skin:    General: Skin is warm and dry.     Findings: No rash.  Neurological:     General: No focal deficit present.     Mental Status: She is alert. Mental status is at  baseline.     Motor: No weakness.  Psychiatric:        Mood and Affect: Mood normal.     ED Results / Procedures / Treatments   Labs (all labs ordered are listed, but only abnormal results are displayed) Labs Reviewed  COMPREHENSIVE METABOLIC PANEL - Abnormal; Notable for the following components:      Result Value   Potassium 3.2 (*)    Glucose, Bld 103 (*)    Calcium 8.8 (*)    Total Bilirubin 0.1 (*)    All other components within normal limits  CBC - Abnormal; Notable for the following components:   RBC 3.50 (*)    Hemoglobin 9.4 (*)    HCT 30.5 (*)    RDW 17.2 (*)    All other components within normal limits  LIPASE, BLOOD  URINALYSIS, ROUTINE W REFLEX MICROSCOPIC  PREGNANCY, URINE    EKG None  Radiology CT ABDOMEN PELVIS W CONTRAST  Result Date: 09/05/2020 CLINICAL DATA:  LLQ abdominal pain Rule out diverticulitis EXAM: CT ABDOMEN AND PELVIS WITH CONTRAST TECHNIQUE: Multidetector CT imaging of the abdomen and pelvis was performed using the standard protocol following bolus administration of intravenous contrast. CONTRAST:  147mL OMNIPAQUE IOHEXOL 300 MG/ML  SOLN COMPARISON:  X-ray abdomen 09/05/2020 FINDINGS: Lower chest: No acute abnormality. Hepatobiliary: Nonspecific subcentimeter calcification within the right hepatic lobe (2:21). Otherwise no focal liver abnormality. No gallstones, gallbladder wall thickening, or pericholecystic fluid. No biliary dilatation. Pancreas: No focal lesion. Normal pancreatic contour. No surrounding inflammatory changes. No main pancreatic ductal dilatation. Spleen: Normal in size without focal abnormality. Adrenals/Urinary Tract: No adrenal nodule bilaterally. Bilateral kidneys enhance symmetrically. There is a fluid density lesion measuring up to 1.4 cm within left kidney likely represents a simple renal cyst. Another subjacent subcentimeter hypodensities too small to characterize. No hydronephrosis. No hydroureter. The urinary bladder is  unremarkable. On delayed imaging, there is no urothelial wall thickening and there are no filling defects in the opacified portions of the bilateral collecting systems or ureters. Stomach/Bowel: Stomach is within normal limits. No evidence of bowel wall thickening or dilatation. Appendix appears normal. Vascular/Lymphatic: No abdominal aorta or iliac aneurysm. Prominent but nonenlarged retroperitoneal lymph nodes. No abdominal, pelvic, or inguinal lymphadenopathy. Reproductive: Couple large intramural an likely submucosal uterine fibroids measuring up to 5.6 cm. The right likely intramural fibroid is coarsely calcified. Three likely submucosal fibroid is not. There is a 4.4 cm simple fluid density cystic lesion within the left ovary with possible thin septation. Otherwise bilateral adnexal regions are unremarkable. Other: No intraperitoneal free fluid. No intraperitoneal free gas. No organized fluid collection. Musculoskeletal: No abdominal wall hernia or abnormality. No  suspicious lytic or blastic osseous lesions. No acute displaced fracture. IMPRESSION: 1. Intramural as well as likely submucosal uterine fibroids measuring up to 5.6 cm. Give large size, comparison with prior cross-sectional imaging would be of value to evaluate stability in size. Recommend pelvic ultrasound for further evaluation. Consider gynecologic consultation. 2. A 4.4 cm simple fluid density cystic lesion within the left ovary with a possible thin septation. If patient premenopausal: No follow-up imaging recommended. Note: This recommendation does not apply to premenarchal patients and to those with increased risk (genetic, family history, elevated tumor markers or other high-risk factors) of ovarian cancer. Reference: JACR 2020 Feb; 17(2):248-254 Electronically Signed   By: Iven Finn M.D.   On: 09/05/2020 21:28   DG Abd 2 Views  Result Date: 09/05/2020 CLINICAL DATA:  Left abdominal pain and constipation. EXAM: ABDOMEN - 2 VIEW  COMPARISON:  None. FINDINGS: Nonobstructive bowel gas pattern. Gas fluid levels within the right abdomen, nonspecific. Average amount of formed stool throughout the left colon. Gallbladder calculus, 8 mm. IMPRESSION: 1. Nonobstructive bowel gas pattern. 2. Gas fluid levels within the right abdomen, nonspecific. Electronically Signed   By: Fidela Salisbury M.D.   On: 09/05/2020 20:07    Procedures Procedures   Medications Ordered in ED Medications - No data to display  ED Course  I have reviewed the triage vital signs and the nursing notes.  Pertinent labs & imaging results that were available during my care of the patient were reviewed by me and considered in my medical decision making (see chart for details).  Patient seen and examined. Work-up initiated.   Vital signs reviewed and are as follows: BP 103/76 (BP Location: Right Arm)   Pulse 89   Temp 98.3 F (36.8 C) (Oral)   Resp 16   Ht 5\' 7"  (1.702 m)   Wt 86.2 kg   LMP 08/16/2020   SpO2 100%   BMI 29.76 kg/m   Discussed results with patient including hemoglobin.  Patient has required blood transfusion in the past but will not tonight.  Discussed low potassium.  She has received a liter of IV fluids.  We discussed x-ray of her abdomen.  At this point, discussed possible options on how to proceed.  This could include treatment of constipation and watchful waiting at home with return if symptoms worsen-or CT imaging to evaluate for diverticulitis or other causes of left lower quadrant and left lateral abdominal pain.  Patient states that she would prefer to proceed with imaging at this time.  CT ordered.  10:14 PM CT reviewed. Pt updated on results. We discussed ovarian cyst, fibroids, liver calcification, renal cyst.  Patient has pelvic ultrasound scheduled on 2/28.  She is encouraged to keep this follow-up.  I encouraged her to tell her primary care doctor about the liver calcification as she states that she had an MRI of the  liver about a year ago that did not show any calcifications.  Encouraged use of OTC pain medications at home.  Patient states that she does not eat anything for nausea.  The patient was urged to return to the Emergency Department immediately with worsening of current symptoms, worsening abdominal pain, persistent vomiting, blood noted in stools, fever, or any other concerns. The patient verbalized understanding.      MDM Rules/Calculators/A&P                          Patient with LLQ abdominal pain. Vitals are stable, no  fever. Labs mild hypokalemia, anemia. Imaging with findings as above.  Current symptoms potentially related to the left ovarian cyst.  Otherwise, no signs of dehydration, patient is tolerating PO's. Lungs are clear and no signs suggestive of PNA. Low concern for appendicitis, cholecystitis, pancreatitis, ruptured viscus, UTI, kidney stone, aortic dissection, aortic aneurysm or other emergent abdominal etiology. Supportive therapy indicated with return if symptoms worsen.   Final Clinical Impression(s) / ED Diagnoses Final diagnoses:  Left ovarian cyst  Uterine leiomyoma, unspecified location  LLQ abdominal pain  Hepatic calcification  Renal cyst    Rx / DC Orders ED Discharge Orders    None       Carlisle Cater, PA-C 09/05/20 [place], [place], DO 09/05/20 2256

## 2020-09-05 NOTE — ED Triage Notes (Signed)
Pt reports Left side abd pain since Wednesday. Last BM was Monday. Denies n/v

## 2020-10-13 ENCOUNTER — Ambulatory Visit: Payer: BLUE CROSS/BLUE SHIELD | Admitting: Physician Assistant

## 2020-11-19 ENCOUNTER — Encounter: Payer: Self-pay | Admitting: Gastroenterology

## 2020-11-19 ENCOUNTER — Other Ambulatory Visit (INDEPENDENT_AMBULATORY_CARE_PROVIDER_SITE_OTHER): Payer: 59

## 2020-11-19 ENCOUNTER — Ambulatory Visit (INDEPENDENT_AMBULATORY_CARE_PROVIDER_SITE_OTHER): Payer: 59 | Admitting: Gastroenterology

## 2020-11-19 VITALS — BP 108/62 | HR 66 | Ht 67.0 in | Wt 185.0 lb

## 2020-11-19 DIAGNOSIS — Z8619 Personal history of other infectious and parasitic diseases: Secondary | ICD-10-CM

## 2020-11-19 DIAGNOSIS — D509 Iron deficiency anemia, unspecified: Secondary | ICD-10-CM

## 2020-11-19 DIAGNOSIS — Z1211 Encounter for screening for malignant neoplasm of colon: Secondary | ICD-10-CM

## 2020-11-19 LAB — CBC
HCT: 32.7 % — ABNORMAL LOW (ref 36.0–46.0)
Hemoglobin: 10.7 g/dL — ABNORMAL LOW (ref 12.0–15.0)
MCHC: 32.6 g/dL (ref 30.0–36.0)
MCV: 82.8 fl (ref 78.0–100.0)
Platelets: 257 10*3/uL (ref 150.0–400.0)
RBC: 3.95 Mil/uL (ref 3.87–5.11)
RDW: 18.1 % — ABNORMAL HIGH (ref 11.5–15.5)
WBC: 3.7 10*3/uL — ABNORMAL LOW (ref 4.0–10.5)

## 2020-11-19 MED ORDER — SUTAB 1479-225-188 MG PO TABS
1.0000 | ORAL_TABLET | Freq: Once | ORAL | 0 refills | Status: AC
Start: 2020-11-19 — End: 2020-11-19

## 2020-11-19 MED ORDER — FAMOTIDINE 40 MG PO TABS: 40.0000 mg | ORAL_TABLET | Freq: Two times a day (BID) | ORAL | 0 refills | Status: DC

## 2020-11-19 NOTE — Patient Instructions (Addendum)
If you are age 47 or older, your body mass index should be between 23-30. Your Body mass index is 28.98 kg/m. If this is out of the aforementioned range listed, please consider follow up with your Primary Care Provider.  If you are age 106 or younger, your body mass index should be between 19-25. Your Body mass index is 28.98 kg/m. If this is out of the aformentioned range listed, please consider follow up with your Primary Care Provider.   Your provider has requested that you go to the basement level for lab work before leaving today. Press "B" on the elevator. The lab is located at the first door on the left as you exit the elevator.  Stop Omeprazole for 2 weeks prior to H. Pylori stool study.   Start Pepcid 40 mg twice while off Omeprazole.

## 2020-11-19 NOTE — Progress Notes (Signed)
11/19/2020 Angel Munoz [id number] 29-May-1976   HISTORY OF PRESENT ILLNESS: This is a 45 year old female who is new to our office.  She is here today to establish GI care.  She just moved here from [place], [place] in August 2021.  She was followed there for her upper GI issues/reflux.  She is on omeprazole 40 mg daily.  She tells me that she was diagnosed with H. pylori and underwent treatment for that.  She had an EGD in March 2020 that was normal.  Gastric biopsies were obtained.  I cannot find those results but she tells me that they were negative for H. Pylori.  She tells me that she has been experiencing upper abdominal discomfort, not pain, but she says that she can hear a "gnawing" sound.  She is concerned about recurrent Hpylori.  She has severe IDA that has been attributed to her heavy menses.  She has uterine fibroids and is following with gynecology to determine what to do about all of that.  Her hemoglobin was down to 6.3 g and November and she ended up hospitalized while visiting in [place] and received 2 units of packed red blood cells and IV iron infusion.  She is on oral iron supplements now.  She tells me that her stools are dark because of iron supplements, but otherwise they are not black or bloody.  She has may be seen blood in her stool on 1 occasion, was told that she had a hemorrhoid.  She has a positive fecal occult blood test in the system but she tells me that she was on her period at that time so thinks that it was inaccurate.  She has never had colonoscopy in the past.  She moves her bowels regularly without issues.  She was referred here by Gus Height, PA-C, for evaluation of rectal bleeding.  Past Medical History:  Diagnosis Date  . Anemia   . Anemia   . Blood transfusion without reported diagnosis   . Fibroid   . Pica in adults    History reviewed. No pertinent surgical history.  reports that she has never smoked. She has never used smokeless  tobacco. She reports previous alcohol use. She reports previous drug use. family history is not on file. Allergies  Allergen Reactions  . Iron Dextran     Other reaction(s): Musculoskeletal Pain Fever, cough      Outpatient Encounter Medications as of 11/19/2020  Medication Sig  . albuterol (VENTOLIN HFA) 108 (90 Base) MCG/ACT inhaler Inhale 1 puff into the lungs every 6 (six) hours as needed for wheezing or shortness of breath.   . famotidine (PEPCID) 40 MG tablet Take 1 tablet (40 mg total) by mouth 2 (two) times daily.  Marland Kitchen omeprazole (PRILOSEC) 20 MG capsule Take 40 mg by mouth daily.  . Sodium Sulfate-Mag Sulfate-KCl (SUTAB) [phone] MG TABS Take 1 kit by mouth once for 1 dose. BIN: [id number] PCN: CN GROUP: [id number] MEMBER ID: [id number]; DO NOT RUN AS CASH   No facility-administered encounter medications on file as of 11/19/2020.     REVIEW OF SYSTEMS  : All other systems reviewed and negative except where noted in the History of Present Illness.   PHYSICAL EXAM: BP 108/62   Pulse 66   Ht 5\' 7"  (1.702 m)   Wt 185 lb (83.9 kg)   BMI 28.98 kg/m  General: Well developed AA female in no acute distress Head: Normocephalic and atraumatic Eyes:  Sclerae anicteric, conjunctiva pink. Ears:  Normal auditory acuity Lungs: Clear throughout to auscultation; no W/R/R. Heart: Regular rate and rhythm; no M/R/G. Abdomen: Soft, non-distended.  BS present.  Non-tender. Rectal:  Will be done at the time of colonoscopy. Musculoskeletal: Symmetrical with no gross deformities  Skin: No lesions on visible extremities Extremities: No edema  Neurological: Alert oriented x 4, grossly non-focal Psychological:  Alert and cooperative. Normal mood and affect  ASSESSMENT AND PLAN: *Epigastric abdominal discomfort: No pain per se, just very noisy and active.  Has history of H. pylori that was treated in [place].  She tells me that follow-up biopsies were negative.  She has been maintained on  omeprazole 40 mg daily.  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  We will have her discontinue PPI for 2 weeks and take Pepcid 40 mg twice daily in its place for that 2 weeks (is to stop the pepcid 24 hours prior) and will perform H. pylori stool antigen. *Colorectal cancer: She has never had colonoscopy in the past.  We will schedule with Dr. Loletha Carrow.  The risks, benefits, and alternatives to colonoscopy were discussed with the patient and she consents to proceed.  *Severe iron deficiency anemia: Received 2 units of packed red blood cells and 1 IV iron infusion in November 2021 while in [place].  This has been attributed to her heavy menses.  She has uterine fibroids.  Is following with gynecology.  We will recheck iron studies today.  If still very low then will refer to hematology for further IV iron infusions.   CC:  Darrol Jump, PA-C

## 2020-11-20 LAB — IBC + FERRITIN
Ferritin: 9.9 ng/mL — ABNORMAL LOW (ref 10.0–291.0)
Iron: 29 ug/dL — ABNORMAL LOW (ref 42–145)
Saturation Ratios: 6.9 % — ABNORMAL LOW (ref 20.0–50.0)
Transferrin: 299 mg/dL (ref 212.0–360.0)

## 2020-11-20 NOTE — Progress Notes (Signed)
____________________________________________________________  Attending physician addendum:  Thank you for sending this case to me. I have reviewed the entire note and agree with the plan.   Evone Arseneau Danis, MD  ____________________________________________________________  

## 2020-11-21 ENCOUNTER — Emergency Department (HOSPITAL_COMMUNITY): Payer: 59

## 2020-11-21 ENCOUNTER — Emergency Department (HOSPITAL_COMMUNITY)
Admission: EM | Admit: 2020-11-21 | Discharge: 2020-11-21 | Disposition: A | Discharge status: Home / Self Care | Payer: 59 | Attending: Emergency Medicine | Admitting: Emergency Medicine

## 2020-11-21 ENCOUNTER — Encounter (HOSPITAL_COMMUNITY): Payer: Self-pay | Admitting: Obstetrics and Gynecology

## 2020-11-21 ENCOUNTER — Other Ambulatory Visit: Payer: Self-pay

## 2020-11-21 ENCOUNTER — Inpatient Hospital Stay (HOSPITAL_COMMUNITY)
Admission: AD | Admit: 2020-11-21 | Discharge: 2020-11-21 | Disposition: A | Discharge status: Home / Self Care | Payer: 59 | Attending: Obstetrics and Gynecology | Admitting: Obstetrics and Gynecology

## 2020-11-21 DIAGNOSIS — R102 Pelvic and perineal pain: Secondary | ICD-10-CM

## 2020-11-21 DIAGNOSIS — N939 Abnormal uterine and vaginal bleeding, unspecified: Secondary | ICD-10-CM | POA: Diagnosis present

## 2020-11-21 DIAGNOSIS — Z79899 Other long term (current) drug therapy: Secondary | ICD-10-CM | POA: Insufficient documentation

## 2020-11-21 DIAGNOSIS — Z3202 Encounter for pregnancy test, result negative: Secondary | ICD-10-CM | POA: Insufficient documentation

## 2020-11-21 LAB — CBC WITH DIFFERENTIAL/PLATELET
Abs Immature Granulocytes: 0 10*3/uL (ref 0.00–0.07)
Basophils Absolute: 0 10*3/uL (ref 0.0–0.1)
Basophils Relative: 1 %
Eosinophils Absolute: 0 10*3/uL (ref 0.0–0.5)
Eosinophils Relative: 1 %
HCT: 36.3 % (ref 36.0–46.0)
Hemoglobin: 10.8 g/dL — ABNORMAL LOW (ref 12.0–15.0)
Immature Granulocytes: 0 %
Lymphocytes Relative: 42 %
Lymphs Abs: 1.3 10*3/uL (ref 0.7–4.0)
MCH: 26.3 pg (ref 26.0–34.0)
MCHC: 29.8 g/dL — ABNORMAL LOW (ref 30.0–36.0)
MCV: 88.5 fL (ref 80.0–100.0)
Monocytes Absolute: 0.4 10*3/uL (ref 0.1–1.0)
Monocytes Relative: 13 %
Neutro Abs: 1.3 10*3/uL — ABNORMAL LOW (ref 1.7–7.7)
Neutrophils Relative %: 43 %
Platelets: 273 10*3/uL (ref 150–400)
RBC: 4.1 MIL/uL (ref 3.87–5.11)
RDW: 16.6 % — ABNORMAL HIGH (ref 11.5–15.5)
WBC: 3.2 10*3/uL — ABNORMAL LOW (ref 4.0–10.5)
nRBC: 0 % (ref 0.0–0.2)

## 2020-11-21 LAB — BASIC METABOLIC PANEL
Anion gap: 4 — ABNORMAL LOW (ref 5–15)
BUN: 10 mg/dL (ref 6–20)
CO2: 25 mmol/L (ref 22–32)
Calcium: 8.8 mg/dL — ABNORMAL LOW (ref 8.9–10.3)
Chloride: 107 mmol/L (ref 98–111)
Creatinine, Ser: 0.77 mg/dL (ref 0.44–1.00)
GFR, Estimated: >60 mL/min (ref >60)
Glucose, Bld: 93 mg/dL (ref 70–99)
Potassium: 3.5 mmol/L (ref 3.5–5.1)
Sodium: 136 mmol/L (ref 135–145)

## 2020-11-21 LAB — WET PREP, GENITAL
Clue Cells Wet Prep HPF POC: NONE SEEN
Sperm: NONE SEEN
Trich, Wet Prep: NONE SEEN
Yeast Wet Prep HPF POC: NONE SEEN

## 2020-11-21 LAB — POCT PREGNANCY, URINE: Preg Test, Ur: NEGATIVE

## 2020-11-21 MED ORDER — NAPROXEN 375 MG PO TABS: 375.0000 mg | ORAL_TABLET | Freq: Two times a day (BID) | ORAL | 0 refills | Status: DC

## 2020-11-21 NOTE — ED Triage Notes (Signed)
Pt reports two days of right sided pelvic pain and vaginal bleeding. Sent from [facility] since she had a negative pregnancy test.

## 2020-11-21 NOTE — [facility] Provider Note (Signed)
Event Date/Time   First Provider Initiated Contact with Patient 11/21/20 1203      [name] is a 45 y.o. No obstetric history on file. patient who presents to [facility] today with complaint of vaginal bleeding & abdominal cramping. States she does not think she is pregnant & had a period 2 weeks ago. Symptoms started yesterday. Is not saturating pads. Has been taking progesterone since December due to history of heavy periods but stopped taking them 2 weeks ago. Restarted them the other day.    O BP 107/74 (BP Location: Right Arm)   Pulse 73   Temp 98.1 F (36.7 C) (Oral)   Resp 15  Physical Exam Vitals and nursing note reviewed.  Constitutional:      General: She is not in acute distress.    Appearance: She is well-developed and normal weight.  HENT:     Head: Normocephalic and atraumatic.  Eyes:     General: No scleral icterus. Pulmonary:     Effort: Pulmonary effort is normal. No respiratory distress.  Neurological:     Mental Status: She is alert.  Psychiatric:        Mood and Affect: Mood normal.        Behavior: Behavior normal.     A Medical screening exam complete 1. Negative pregnancy test   2. Vaginal bleeding      P Discharge from [facility]U in stable condition Patient given the option of transfer to [facility] for further evaluation or seek care in outpatient facility of choice- patient prefers going to [facility] for evaluation - S/w [facility] provider about patient transfer  Jorje Guild, NP 11/21/2020 12:30 PM

## 2020-11-21 NOTE — ED Notes (Signed)
Pt reminded of needed urine sample. Pt states she is unable to urinate at this time.

## 2020-11-21 NOTE — ED Notes (Signed)
Pt refused primary leads.

## 2020-11-21 NOTE — ED Provider Notes (Signed)
Care assumed at shift change from [facility], see her note for full HPI and work-up.  Presenting for abnormal uterine bleeding, recently stopped progesterone on 30 April.  She is followed by gynecology.  Work-up reveals reassuring hemoglobin of 09.9, metabolic panel is unremarkable. Physical Exam  BP 112/77   Pulse 93   Temp (!) 97.3 F (36.3 C) (Oral)   Resp 15   LMP 10/28/2020 (Exact Date)   SpO2 98%   Physical Exam Vitals and nursing note reviewed.  Constitutional:      General: She is not in acute distress.    Appearance: She is well-developed.  HENT:     Head: Normocephalic and atraumatic.  Eyes:     Conjunctiva/sclera: Conjunctivae normal.  Cardiovascular:     Rate and Rhythm: Normal rate.  Pulmonary:     Effort: Pulmonary effort is normal.  Neurological:     Mental Status: She is alert.  Psychiatric:        Mood and Affect: Mood normal.        Behavior: Behavior normal.     ED Course/Procedures     Procedures  MDM  Patient requesting to leave prior to pelvic ultrasound.  Believe this is very reasonable, hemoglobin is stable.  Bleeding is likely expected after discontinuing progesterone.  Instructed follow-up with gynecology.  Return precautions.  Discharged in no distress.       Chrissa Meetze, [place] N, PA-C 11/21/20 1603    Lucrezia Starch, MD 11/23/20 [phone]

## 2020-11-21 NOTE — Discharge Instructions (Signed)
Get help right away if you: Pass out. Have bleeding that soaks through a pad every hour. Have pain in the abdomen. Have a fever or chills. Become sweaty or weak. Pass large blood clots from your vagina.

## 2020-11-21 NOTE — ED Provider Notes (Signed)
[facility] DEPARTMENT Provider Note   CSN: [id number] Arrival date & time: 11/21/20  1244     History Chief Complaint  Patient presents with  . Vaginal Bleeding  . Abdominal Pain    Angel Munoz is a 45 y.o. female who presents for vaginal bleeding. She has a hx of ovarian cysts, and fibroid. Patient's menstrual cycle ended 2 weeks ago.  The patient states that yesterday she began having some spotting and bleeding.  She has never had abnormal bleeding.  She says that she was on Premarin but quit taking it because she ran out did not have time to go pick up.  She is only had about 4 doses of the medication.  She has some mild cramping pain in the right lower quadrant of her pelvis.  She denies any urinary symptoms, fever, back pain.  Pain is mild, does not radiate, nothing seems to make it worse or better.  HPI     Past Medical History:  Diagnosis Date  . Anemia   . Blood transfusion without reported diagnosis   . Fibroid   . Pica in adults     Patient Active Problem List   Diagnosis Date Noted  . Special screening for malignant neoplasms, colon 11/19/2020  . Iron deficiency anemia 11/19/2020  . History of Helicobacter pylori infection 11/19/2020    No past surgical history on file.   OB History   No obstetric history on file.     No family history on file.  Social History   Tobacco Use  . Smoking status: Never Smoker  . Smokeless tobacco: Never Used  Substance Use Topics  . Alcohol use: Not Currently  . Drug use: Not Currently    Home Medications Prior to Admission medications   Medication Sig Start Date End Date Taking? Authorizing Provider  albuterol (VENTOLIN HFA) 108 (90 Base) MCG/ACT inhaler Inhale 1 puff into the lungs every 6 (six) hours as needed for wheezing or shortness of breath.  05/07/20   [provider]  famotidine (PEPCID) 40 MG tablet Take 1 tablet (40 mg total) by mouth 2 (two) times daily.  11/19/20   Zehr, Laban Emperor, PA-C  omeprazole (PRILOSEC) 20 MG capsule Take 40 mg by mouth daily. 05/07/20   [provider]    Allergies    Iron dextran  Review of Systems   Review of Systems Ten systems reviewed and are negative for acute change, except as noted in the HPI.   Physical Exam Updated Vital Signs BP 108/75 (BP Location: Right Arm)   Pulse (!) 56   Temp 98 F (36.7 C)   Resp 14   LMP 10/28/2020 (Exact Date)   SpO2 100%   Physical Exam Vitals and nursing note reviewed.  Constitutional:      General: She is not in acute distress.    Appearance: She is well-developed. She is not diaphoretic.  HENT:     Head: Normocephalic and atraumatic.  Eyes:     General: No scleral icterus.    Conjunctiva/sclera: Conjunctivae normal.  Cardiovascular:     Rate and Rhythm: Normal rate and regular rhythm.     Heart sounds: Normal heart sounds. No murmur heard. No friction rub. No gallop.   Pulmonary:     Effort: Pulmonary effort is normal. No respiratory distress.     Breath sounds: Normal breath sounds.  Abdominal:     General: Bowel sounds are normal. There is no distension.  Palpations: Abdomen is soft. There is no mass.     Tenderness: There is no abdominal tenderness. There is no guarding.  Genitourinary:    Comments: Pelvic exam: normal external genitalia, vulva, vagina, cervix, uterus and adnexa, VULVA: normal appearing vulva with no masses, tenderness or lesions, VAGINA: normal appearing vagina with normal color and discharge, no lesions, CERVIX: DNA probe for chlamydia and GC obtained, cervical motion tenderness absent, red blood oozing from cervical os, UTERUS: uterus is normal size, shape, consistency and nontender, ADNEXA: tenderness right. Musculoskeletal:     Cervical back: Normal range of motion.  Skin:    General: Skin is warm and dry.  Neurological:     Mental Status: She is alert and oriented to person, place, and time.  Psychiatric:         Behavior: Behavior normal.     ED Results / Procedures / Treatments   Labs (all labs ordered are listed, but only abnormal results are displayed) Labs Reviewed  WET PREP, GENITAL  BASIC METABOLIC PANEL  CBC WITH DIFFERENTIAL/PLATELET  RPR  URINALYSIS, ROUTINE W REFLEX MICROSCOPIC  GC/CHLAMYDIA PROBE AMP ([place]) NOT AT [facility]    EKG None  Radiology No results found.  Procedures Procedures   Medications Ordered in ED Medications - No data to display  ED Course  I have reviewed the triage vital signs and the nursing notes.  Pertinent labs & imaging results that were available during my care of the patient were reviewed by me and considered in my medical decision making (see chart for details).    MDM Rules/Calculators/A&P                         45 year old female here with abnormal uterine bleeding. I ordered and reviewed labs that include CBC with mild leukopenia, mild anemia of 10.8, BMP with very mild hypocalcemia of insignificant value, wet prep without significant abnormality.  Patient asked for transvaginal ultrasound.  I have very low suspicion for emergent abnormality as she had minimal tenderness on the right adnexa and appears to be fairly comfortable without CMT.  Patient is awaiting ultrasound and I have given signout to [place] for follow-up.  I expect she will be discharged and I have ordered anti-inflammatory medications for pain relief. Final Clinical Impression(s) / ED Diagnoses Final diagnoses:  None    Rx / DC Orders ED Discharge Orders    None       Margarita Mail, PA-C 11/22/20 0645    Lucrezia Starch, MD 11/23/20 [phone]

## 2020-11-21 NOTE — MAU Note (Signed)
Pt reports to [facility]au with c/o vag bleeding and cramping that started yesterday. Denies Pregnancy. States LMP was 11/06/20

## 2020-11-22 ENCOUNTER — Emergency Department (HOSPITAL_BASED_OUTPATIENT_CLINIC_OR_DEPARTMENT_OTHER)
Admission: EM | Admit: 2020-11-22 | Discharge: 2020-11-22 | Disposition: A | Discharge status: Home / Self Care | Payer: 59 | Attending: Physician Assistant | Admitting: Physician Assistant

## 2020-11-22 ENCOUNTER — Other Ambulatory Visit: Payer: Self-pay

## 2020-11-22 ENCOUNTER — Encounter (HOSPITAL_BASED_OUTPATIENT_CLINIC_OR_DEPARTMENT_OTHER): Payer: Self-pay | Admitting: *Deleted

## 2020-11-22 DIAGNOSIS — N939 Abnormal uterine and vaginal bleeding, unspecified: Secondary | ICD-10-CM | POA: Diagnosis not present

## 2020-11-22 DIAGNOSIS — Z5321 Procedure and treatment not carried out due to patient leaving prior to being seen by health care provider: Secondary | ICD-10-CM | POA: Diagnosis not present

## 2020-11-22 LAB — COMPREHENSIVE METABOLIC PANEL
ALT: 11 U/L (ref 0–44)
AST: 17 U/L (ref 15–41)
Albumin: 3.6 g/dL (ref 3.5–5.0)
Alkaline Phosphatase: 71 U/L (ref 38–126)
Anion gap: 7 (ref 5–15)
BUN: 10 mg/dL (ref 6–20)
CO2: 24 mmol/L (ref 22–32)
Calcium: 8.6 mg/dL — ABNORMAL LOW (ref 8.9–10.3)
Chloride: 108 mmol/L (ref 98–111)
Creatinine, Ser: 0.75 mg/dL (ref 0.44–1.00)
GFR, Estimated: >60 mL/min (ref >60)
Glucose, Bld: 103 mg/dL — ABNORMAL HIGH (ref 70–99)
Potassium: 4 mmol/L (ref 3.5–5.1)
Sodium: 139 mmol/L (ref 135–145)
Total Bilirubin: <0.1 mg/dL — ABNORMAL LOW (ref 0.3–1.2)
Total Protein: 7.1 g/dL (ref 6.5–8.1)

## 2020-11-22 LAB — CBC WITH DIFFERENTIAL/PLATELET
Abs Immature Granulocytes: 0.01 10*3/uL (ref 0.00–0.07)
Basophils Absolute: 0 10*3/uL (ref 0.0–0.1)
Basophils Relative: 1 %
Eosinophils Absolute: 0.1 10*3/uL (ref 0.0–0.5)
Eosinophils Relative: 2 %
HCT: 35.5 % — ABNORMAL LOW (ref 36.0–46.0)
Hemoglobin: 10.8 g/dL — ABNORMAL LOW (ref 12.0–15.0)
Immature Granulocytes: 0 %
Lymphocytes Relative: 39 %
Lymphs Abs: 1.7 10*3/uL (ref 0.7–4.0)
MCH: 26.7 pg (ref 26.0–34.0)
MCHC: 30.4 g/dL (ref 30.0–36.0)
MCV: 87.7 fL (ref 80.0–100.0)
Monocytes Absolute: 0.4 10*3/uL (ref 0.1–1.0)
Monocytes Relative: 10 %
Neutro Abs: 2.1 10*3/uL (ref 1.7–7.7)
Neutrophils Relative %: 48 %
Platelets: 279 10*3/uL (ref 150–400)
RBC: 4.05 MIL/uL (ref 3.87–5.11)
RDW: 16.5 % — ABNORMAL HIGH (ref 11.5–15.5)
WBC: 4.3 10*3/uL (ref 4.0–10.5)
nRBC: 0 % (ref 0.0–0.2)

## 2020-11-22 NOTE — ED Provider Notes (Signed)
Emergency Medicine Provider Triage Evaluation Note  Angel Munoz , a 45 y.o. female  was evaluated in triage.  Pt complains of vaginal bleeding x 3 days. Blood is like a period but feels she had her period a week an a half ago. Today developed back pain and abdominal pain. Has taken aleve for her symptoms.   Review of Systems  Positive: Vaginal bleeding,  Negative:         Urinary complaints, fever, nausea, vomiting  Physical Exam  BP 110/71 (BP Location: Left Arm)   Pulse 78   Temp 98.1 F (36.7 C) (Oral)   Resp 18   Ht 5\' 7"  (1.702 m)   Wt 83.9 kg   LMP 10/28/2020 (Exact Date)   SpO2 99%   BMI 28.98 kg/m  Gen:   Awake, no distress  Resp:  Normal effort MSK:   Moves extremities without difficulty  Other:    Medical Decision Making  Medically screening exam initiated at 7:32 PM.  Appropriate orders placed.  Angel Munoz was informed that the remainder of the evaluation will be completed by another provider, this initial triage assessment does not replace that evaluation, and the importance of remaining in the ED until their evaluation is complete.  Patient had been on progesterone since November daily, she stopped taking this last Thursday (April 30)  then bleeding began Friday of the same week. Patient is wondering why bleeding has lasted so long. She has resumed progesterone since then.    Angel Fitting, PA-C 11/22/20 Angel Platt, MD 11/22/20 [phone]

## 2020-11-22 NOTE — ED Triage Notes (Signed)
C/o vaginal bleeding and abdominal pain since Friday. Hx of fibroids. States she was seen at [facility] ED yesterday and had pelvic exam and blood work done

## 2020-11-22 NOTE — ED Notes (Signed)
Went to check on pt, pt not there; EDP made aware

## 2020-11-23 ENCOUNTER — Other Ambulatory Visit: Payer: Self-pay

## 2020-11-23 ENCOUNTER — Telehealth: Payer: Self-pay | Admitting: Hematology and Oncology

## 2020-11-23 DIAGNOSIS — D509 Iron deficiency anemia, unspecified: Secondary | ICD-10-CM

## 2020-11-23 NOTE — Telephone Encounter (Signed)
Received a new hem referral from [facility] for anemia. MS. Menge has been cld and scheduled to see Dr. Chryl Heck on 5/20 at 10:40am. Pt aware to arrive 20 minutes early.

## 2020-11-26 DIAGNOSIS — N939 Abnormal uterine and vaginal bleeding, unspecified: Secondary | ICD-10-CM | POA: Insufficient documentation

## 2020-11-27 ENCOUNTER — Inpatient Hospital Stay: Payer: 59

## 2020-11-27 ENCOUNTER — Inpatient Hospital Stay: Payer: 59 | Attending: Hematology and Oncology | Admitting: Hematology and Oncology

## 2020-11-27 ENCOUNTER — Encounter: Payer: Self-pay | Admitting: Gastroenterology

## 2020-11-27 NOTE — Progress Notes (Deleted)
[facility] CONSULT NOTE  Patient Care Team: Pcp, No as PCP - General  CHIEF COMPLAINTS/PURPOSE OF CONSULTATION:  ***  ASSESSMENT & PLAN:  No problem-specific Assessment & Plan notes found for this encounter.  No orders of the defined types were placed in this encounter.    HISTORY OF PRESENTING ILLNESS:  Angel Munoz 45 y.o. female is here because of ***  REVIEW OF SYSTEMS:   Constitutional: Denies fevers, chills or abnormal night sweats Eyes: Denies blurriness of vision, double vision or watery eyes Ears, nose, mouth, throat, and face: Denies mucositis or sore throat Respiratory: Denies cough, dyspnea or wheezes Cardiovascular: Denies palpitation, chest discomfort or lower extremity swelling Gastrointestinal:  Denies nausea, heartburn or change in bowel habits Skin: Denies abnormal skin rashes Lymphatics: Denies new lymphadenopathy or easy bruising Neurological:Denies numbness, tingling or new weaknesses Behavioral/Psych: Mood is stable, no new changes  All other systems were reviewed with the patient and are negative.  MEDICAL HISTORY:  Past Medical History:  Diagnosis Date  . Anemia   . Blood transfusion without reported diagnosis   . Fibroid   . Pica in adults     SURGICAL HISTORY: No past surgical history on file.  SOCIAL HISTORY: Social History   Socioeconomic History  . Marital status: Married    Spouse name: Not on file  . Number of children: Not on file  . Years of education: Not on file  . Highest education level: Not on file  Occupational History  . Not on file  Tobacco Use  . Smoking status: Never Smoker  . Smokeless tobacco: Never Used  Substance and Sexual Activity  . Alcohol use: Not Currently  . Drug use: Not Currently  . Sexual activity: Not Currently  Other Topics Concern  . Not on file  Social History Narrative  . Not on file   Social Determinants of Health   Financial Resource Strain: Not on file  Food  Insecurity: Not on file  Transportation Needs: Not on file  Physical Activity: Not on file  Stress: Not on file  Social Connections: Not on file  Intimate Partner Violence: Not on file    FAMILY HISTORY: No family history on file.  ALLERGIES:  is allergic to iron dextran.  MEDICATIONS:  Current Outpatient Medications  Medication Sig Dispense Refill  . albuterol (VENTOLIN HFA) 108 (90 Base) MCG/ACT inhaler Inhale 1 puff into the lungs every 6 (six) hours as needed for wheezing or shortness of breath.     . famotidine (PEPCID) 40 MG tablet Take 1 tablet (40 mg total) by mouth 2 (two) times daily. 60 tablet 0  . naproxen (NAPROSYN) 375 MG tablet Take 1 tablet (375 mg total) by mouth 2 (two) times daily with a meal. 20 tablet 0  . omeprazole (PRILOSEC) 20 MG capsule Take 40 mg by mouth daily.     No current facility-administered medications for this visit.     PHYSICAL EXAMINATION: ECOG PERFORMANCE STATUS: {CHL ONC ECOG [phone]}  There were no vitals filed for this visit. There were no vitals filed for this visit.  GENERAL:alert, no distress and comfortable SKIN: skin color, texture, turgor are normal, no rashes or significant lesions EYES: normal, conjunctiva are pink and non-injected, sclera clear OROPHARYNX:no exudate, no erythema and lips, buccal mucosa, and tongue normal  NECK: supple, thyroid normal size, non-tender, without nodularity LYMPH:  no palpable lymphadenopathy in the cervical, axillary or inguinal LUNGS: clear to auscultation and percussion with normal breathing effort HEART:  regular rate & rhythm and no murmurs and no lower extremity edema ABDOMEN:abdomen soft, non-tender and normal bowel sounds Musculoskeletal:no cyanosis of digits and no clubbing  PSYCH: alert & oriented x 3 with fluent speech NEURO: no focal motor/sensory deficits  LABORATORY DATA:  I have reviewed the data as listed Lab Results  Component Value Date   WBC 4.3 11/22/2020   HGB  10.8 (L) 11/22/2020   HCT 35.5 (L) 11/22/2020   MCV 87.7 11/22/2020   PLT 279 11/22/2020     Chemistry      Component Value Date/Time   NA 139 11/22/2020 1950   K 4.0 11/22/2020 1950   CL 108 11/22/2020 1950   CO2 24 11/22/2020 1950   BUN 10 11/22/2020 1950   CREATININE 0.75 11/22/2020 1950      Component Value Date/Time   CALCIUM 8.6 (L) 11/22/2020 1950   ALKPHOS 71 11/22/2020 1950   AST 17 11/22/2020 1950   ALT 11 11/22/2020 1950   BILITOT <0.1 (L) 11/22/2020 1950       RADIOGRAPHIC STUDIES: I have personally reviewed the radiological images as listed and agreed with the findings in the report. No results found.  All questions were answered. The patient knows to call the clinic with any problems, questions or concerns. I spent *** minutes in the care of this patient including H and P, review of records, counseling and coordination of care.     Benay Pike, MD 11/27/2020 10:23 AM

## 2020-12-13 ENCOUNTER — Other Ambulatory Visit: Payer: Self-pay | Admitting: Gastroenterology

## 2020-12-18 ENCOUNTER — Other Ambulatory Visit: Payer: 59

## 2020-12-18 DIAGNOSIS — Z8619 Personal history of other infectious and parasitic diseases: Secondary | ICD-10-CM

## 2020-12-20 LAB — H. PYLORI ANTIGEN, STOOL: H pylori Ag, Stl: POSITIVE — AB

## 2020-12-23 ENCOUNTER — Telehealth: Payer: Self-pay | Admitting: Gastroenterology

## 2020-12-23 ENCOUNTER — Other Ambulatory Visit: Payer: Self-pay

## 2020-12-23 DIAGNOSIS — Z8619 Personal history of other infectious and parasitic diseases: Secondary | ICD-10-CM

## 2020-12-23 MED ORDER — TALICIA 250-12.5-10 MG PO CPDR: 4.0000 | DELAYED_RELEASE_CAPSULE | Freq: Three times a day (TID) | ORAL | 0 refills | Status: AC

## 2020-12-23 NOTE — Telephone Encounter (Signed)
Patient calling wants to discuss med called in today [place].. Plz advise  thanks

## 2020-12-23 NOTE — Telephone Encounter (Signed)
The pt is upset because [facility] sent her an email that states it will take 3-5 days for the Talicia to be sent to her. She states that she was prescribed antibiotics in [place] and was able to pick her prescription up right away.  I explained that this med was sent to a specialty pharmacy and I have no control over how long it takes for them to get her the medication.  I explained that it is not urgent that she start the meds.  She will take Pepcid for night time discomfort in her stomach and call back with any further concerns.

## 2020-12-24 ENCOUNTER — Telehealth: Payer: Self-pay | Admitting: *Deleted

## 2020-12-24 NOTE — Telephone Encounter (Signed)
Received PA request from [facility] for [place]. While in chart noticed patient was seen today by another GI doctor and give Pylera. Will hold off on Talicia at this time. Patient will need to pick one GI doctor per [place], [place].

## 2021-01-09 ENCOUNTER — Other Ambulatory Visit: Payer: Self-pay | Admitting: Gastroenterology

## 2021-01-13 ENCOUNTER — Encounter: Payer: 59 | Admitting: Advanced Practice Midwife

## 2021-01-15 DIAGNOSIS — R1032 Left lower quadrant pain: Secondary | ICD-10-CM | POA: Insufficient documentation

## 2021-01-15 DIAGNOSIS — R300 Dysuria: Secondary | ICD-10-CM | POA: Insufficient documentation

## 2021-01-15 DIAGNOSIS — R1900 Intra-abdominal and pelvic swelling, mass and lump, unspecified site: Secondary | ICD-10-CM | POA: Insufficient documentation

## 2021-01-17 ENCOUNTER — Emergency Department (HOSPITAL_BASED_OUTPATIENT_CLINIC_OR_DEPARTMENT_OTHER): Payer: 59

## 2021-01-17 ENCOUNTER — Encounter (HOSPITAL_BASED_OUTPATIENT_CLINIC_OR_DEPARTMENT_OTHER): Payer: Self-pay | Admitting: Emergency Medicine

## 2021-01-17 ENCOUNTER — Other Ambulatory Visit: Payer: Self-pay

## 2021-01-17 ENCOUNTER — Emergency Department (HOSPITAL_BASED_OUTPATIENT_CLINIC_OR_DEPARTMENT_OTHER)
Admission: EM | Admit: 2021-01-17 | Discharge: 2021-01-17 | Disposition: A | Discharge status: Home / Self Care | Payer: 59 | Attending: Emergency Medicine | Admitting: Emergency Medicine

## 2021-01-17 DIAGNOSIS — R1032 Left lower quadrant pain: Secondary | ICD-10-CM | POA: Diagnosis present

## 2021-01-17 DIAGNOSIS — N83202 Unspecified ovarian cyst, left side: Secondary | ICD-10-CM | POA: Diagnosis not present

## 2021-01-17 LAB — URINALYSIS, ROUTINE W REFLEX MICROSCOPIC
Bilirubin Urine: NEGATIVE
Glucose, UA: NEGATIVE mg/dL
Hgb urine dipstick: NEGATIVE
Ketones, ur: NEGATIVE mg/dL
Leukocytes,Ua: NEGATIVE
Nitrite: NEGATIVE
Protein, ur: NEGATIVE mg/dL
Specific Gravity, Urine: 1.01 (ref 1.005–1.030)
pH: 7 (ref 5.0–8.0)

## 2021-01-17 LAB — CBC WITH DIFFERENTIAL/PLATELET
Abs Immature Granulocytes: 0.02 10*3/uL (ref 0.00–0.07)
Basophils Absolute: 0 10*3/uL (ref 0.0–0.1)
Basophils Relative: 0 %
Eosinophils Absolute: 0.1 10*3/uL (ref 0.0–0.5)
Eosinophils Relative: 2 %
HCT: 33.3 % — ABNORMAL LOW (ref 36.0–46.0)
Hemoglobin: 10.4 g/dL — ABNORMAL LOW (ref 12.0–15.0)
Immature Granulocytes: 0 %
Lymphocytes Relative: 39 %
Lymphs Abs: 1.9 10*3/uL (ref 0.7–4.0)
MCH: 27.5 pg (ref 26.0–34.0)
MCHC: 31.2 g/dL (ref 30.0–36.0)
MCV: 88.1 fL (ref 80.0–100.0)
Monocytes Absolute: 0.5 10*3/uL (ref 0.1–1.0)
Monocytes Relative: 11 %
Neutro Abs: 2.2 10*3/uL (ref 1.7–7.7)
Neutrophils Relative %: 48 %
Platelets: 210 10*3/uL (ref 150–400)
RBC: 3.78 MIL/uL — ABNORMAL LOW (ref 3.87–5.11)
RDW: 15.9 % — ABNORMAL HIGH (ref 11.5–15.5)
WBC: 4.8 10*3/uL (ref 4.0–10.5)
nRBC: 0 % (ref 0.0–0.2)

## 2021-01-17 LAB — LIPASE, BLOOD: Lipase: 38 U/L (ref 11–51)

## 2021-01-17 LAB — COMPREHENSIVE METABOLIC PANEL
ALT: 10 U/L (ref 0–44)
AST: 19 U/L (ref 15–41)
Albumin: 3.1 g/dL — ABNORMAL LOW (ref 3.5–5.0)
Alkaline Phosphatase: 55 U/L (ref 38–126)
Anion gap: 6 (ref 5–15)
BUN: 9 mg/dL (ref 6–20)
CO2: 26 mmol/L (ref 22–32)
Calcium: 8.1 mg/dL — ABNORMAL LOW (ref 8.9–10.3)
Chloride: 104 mmol/L (ref 98–111)
Creatinine, Ser: 0.66 mg/dL (ref 0.44–1.00)
GFR, Estimated: >60 mL/min (ref >60)
Glucose, Bld: 95 mg/dL (ref 70–99)
Potassium: 4 mmol/L (ref 3.5–5.1)
Sodium: 136 mmol/L (ref 135–145)
Total Bilirubin: 0.1 mg/dL — ABNORMAL LOW (ref 0.3–1.2)
Total Protein: 6.4 g/dL — ABNORMAL LOW (ref 6.5–8.1)

## 2021-01-17 LAB — PREGNANCY, URINE: Preg Test, Ur: NEGATIVE

## 2021-01-17 MED ORDER — SODIUM CHLORIDE 0.9 % IV BOLUS
1000.0000 mL | Freq: Once | INTRAVENOUS | Status: AC
  Administered 2021-01-17: 1000 mL via INTRAVENOUS

## 2021-01-17 NOTE — Discharge Instructions (Addendum)
If you develop new or recurrent pain in your abdomen, especially if it does not go away or becomes severe, you need to return to the ER or see your OB/GYN immediately as this could be a twisting of your ovary.  Otherwise call your OB/GYN tomorrow to set up an outpatient appointment to follow-up for this ovarian cyst seen.

## 2021-01-17 NOTE — ED Notes (Signed)
Patient transported to Ultrasound 

## 2021-01-17 NOTE — ED Triage Notes (Signed)
Pt reports middle and right sided abdominal pain that radiates to her back x 3 days. Denies N/V.

## 2021-01-17 NOTE — ED Notes (Signed)
Per radiology and [facility], CT awaiting results of pregnancy test prior to imaging.

## 2021-01-17 NOTE — ED Provider Notes (Signed)
[facility] Provider Note   CSN: [id number] Arrival date & time: 01/17/21  0751     History Chief Complaint  Patient presents with   Abdominal Pain    Angel Munoz is a 45 y.o. female.  HPI 45 year old female presents with left lower abdominal pain and left flank pain.  This started about 3 days ago.  It comes and goes.  It is a sharp type pain, at times severe.  Right now is about a 6 out of 10.  Nothing she does makes it occur.  Pain feels like it originates in her left flank and is going into her left groin.  1 time it hurt when she urinated in her left abdomen but she not really having dysuria.  She had a pelvic exam 2 days ago by her OB/GYN that was tender on the exam but no obvious cause found.  She has taken Tylenol which partially helps. No weakness/numbness in her legs.   Past Medical History:  Diagnosis Date   Anemia    Blood transfusion without reported diagnosis    Fibroid    Pica in adults     Patient Active Problem List   Diagnosis Date Noted   Special screening for malignant neoplasms, colon 11/19/2020   Iron deficiency anemia 93/71/6967   History of Helicobacter pylori infection 11/19/2020    History reviewed. No pertinent surgical history.   OB History   No obstetric history on file.     No family history on file.  Social History   Tobacco Use   Smoking status: Never   Smokeless tobacco: Never  Substance Use Topics   Alcohol use: Not Currently   Drug use: Not Currently    Home Medications Prior to Admission medications   Medication Sig Start Date End Date Taking? Authorizing Provider  albuterol (VENTOLIN HFA) 108 (90 Base) MCG/ACT inhaler Inhale 1 puff into the lungs every 6 (six) hours as needed for wheezing or shortness of breath.  05/07/20   [provider]  famotidine (PEPCID) 40 MG tablet TAKE 1 TABLET BY MOUTH TWICE A DAY 01/12/21   Zehr, Laban Emperor, PA-C  naproxen (NAPROSYN) 375 MG tablet  Take 1 tablet (375 mg total) by mouth 2 (two) times daily with a meal. 11/21/20   Harris, Abigail, PA-C  omeprazole (PRILOSEC) 20 MG capsule Take 40 mg by mouth daily. 05/07/20   [provider]    Allergies    Iron dextran  Review of Systems   Review of Systems  Constitutional:  Negative for fever.  Gastrointestinal:  Positive for abdominal pain. Negative for diarrhea and vomiting.  Genitourinary:  Positive for flank pain. Negative for dysuria.  Musculoskeletal:  Positive for back pain.  Skin:  Negative for rash.  Neurological:  Negative for weakness and numbness.  All other systems reviewed and are negative.  Physical Exam Updated Vital Signs BP 130/89 (BP Location: Right Arm)   Pulse 80   Temp 98.2 F (36.8 C) (Oral)   Resp 17   LMP 12/16/2020   SpO2 100%   Physical Exam Vitals and nursing note reviewed.  Constitutional:      General: She is not in acute distress.    Appearance: She is well-developed. She is not ill-appearing or diaphoretic.  HENT:     Head: Normocephalic and atraumatic.     Right Ear: External ear normal.     Left Ear: External ear normal.     Nose: Nose normal.  Eyes:  General:        Right eye: No discharge.        Left eye: No discharge.  Cardiovascular:     Rate and Rhythm: Normal rate and regular rhythm.     Heart sounds: Normal heart sounds.  Pulmonary:     Effort: Pulmonary effort is normal.     Breath sounds: Normal breath sounds.  Abdominal:     Palpations: Abdomen is soft.     Tenderness: There is abdominal tenderness (mild, she states this feels like its from her known fibroids) in the right lower quadrant. There is left CVA tenderness. There is no right CVA tenderness.  Skin:    General: Skin is warm and dry.     Findings: No rash.  Neurological:     Mental Status: She is alert.  Psychiatric:        Mood and Affect: Mood is not anxious.    ED Results / Procedures / Treatments   Labs (all labs ordered are listed,  but only abnormal results are displayed) Labs Reviewed  COMPREHENSIVE METABOLIC PANEL - Abnormal; Notable for the following components:      Result Value   Calcium 8.1 (*)    Total Protein 6.4 (*)    Albumin 3.1 (*)    Total Bilirubin 0.1 (*)    All other components within normal limits  CBC WITH DIFFERENTIAL/PLATELET - Abnormal; Notable for the following components:   RBC 3.78 (*)    Hemoglobin 10.4 (*)    HCT 33.3 (*)    RDW 15.9 (*)    All other components within normal limits  URINALYSIS, ROUTINE W REFLEX MICROSCOPIC  PREGNANCY, URINE  LIPASE, BLOOD    EKG None  Radiology CT Renal Stone Study  Result Date: 01/17/2021 CLINICAL DATA:  Flank pain with kidney stone. Urinary frequency and low abdominal pain for 3-4 days EXAM: CT ABDOMEN AND PELVIS WITHOUT CONTRAST TECHNIQUE: Multidetector CT imaging of the abdomen and pelvis was performed following the standard protocol without IV contrast. COMPARISON:  09/05/2020 FINDINGS: Lower chest:  No contributory findings. Hepatobiliary: No focal liver abnormality.No evidence of biliary obstruction or stone. Pancreas: Unremarkable. Spleen: Unremarkable. Adrenals/Urinary Tract: Negative adrenals. No hydronephrosis or stone. Unremarkable bladder. Stomach/Bowel:  No obstruction. No appendicitis. Vascular/Lymphatic: No acute vascular abnormality. Subtle retroperitoneal fat haziness which is chronic compared to prior, presumably normal for the patient. No mass or adenopathy. Reproductive:Enlarged uterus due to fibroids including a 5 cm fibroid which is subserosal based on prior contrasted abdominal CT. Persisting left ovarian cystic density measuring 4.4 cm. Other: No ascites or pneumoperitoneum. Musculoskeletal: No acute abnormalities. IMPRESSION: 1. No hydronephrosis or stone. 2. Fibroid uterus including a 5 cm subserosal fibroid. 3. Persisting left ovarian cyst (4.4 cm), recommend follow-up for pelvic ultrasound. Electronically Signed   By: Monte Fantasia M.D.   On: 01/17/2021 10:41   US PELVIC COMPLETE W TRANSVAGINAL AND TORSION R/O  Result Date: 01/17/2021 CLINICAL DATA:  Left lower quadrant pain for 1 week, fibroids, history of ovarian cyst EXAM: TRANSABDOMINAL AND TRANSVAGINAL ULTRASOUND OF PELVIS DOPPLER ULTRASOUND OF OVARIES TECHNIQUE: Both transabdominal and transvaginal ultrasound examinations of the pelvis were performed. Transabdominal technique was performed for global imaging of the pelvis including uterus, ovaries, adnexal regions, and pelvic cul-de-sac. It was necessary to proceed with endovaginal exam following the transabdominal exam to visualize the endometrium and ovaries. Color and duplex Doppler ultrasound was utilized to evaluate blood flow to the ovaries. COMPARISON:  None. FINDINGS: Uterus Measurements: 14.9 x  8.7 x 8.9 cm = volume: 609 mL. Multiple fibroids measuring up to 5.1 cm. Endometrium Thickness: 6 mm.  No focal abnormality visualized. Right ovary Measurements: 2.3 x 1.9 x 2.1 cm = volume: 5 mL. Normal appearance/no adnexal mass. Multiple subcentimeter follicles. Left ovary Measurements: 6.0 x 4.7 x 3.9 cm = volume: 57 mL. Thinly septated, benign, functional cyst of the left ovary measuring 5.5 cm. Pulsed Doppler evaluation of both ovaries demonstrates normal low-resistance arterial and venous waveforms. Other findings Trace free fluid in the low pelvis. IMPRESSION: 1. No definite ultrasound abnormality of the pelvis to explain left lower quadrant pain. 2. The left ovary is enlarged by a thinly septated, almost certainly benign, functional cyst of the left ovary measuring 5.5 cm. Recommend follow-up US in 3-6 months. Note: This recommendation does not apply to premenarchal patients or to those with increased risk (genetic, family history, elevated tumor markers or other high-risk factors) of ovarian cancer. Reference: Radiology 2019 Nov; 293(2):359-371. 3. Please note that any enlarged ovary may serve as a nidus of torsion,  which may be intermittent or incomplete. Arterial and venous flow is present to the ovaries bilaterally at the time of this examination. 4. Multiple uterine fibroids measuring up to 5.1 cm. Electronically Signed   By: Eddie Candle M.D.   On: 01/17/2021 12:00    Procedures Procedures   Medications Ordered in ED Medications  sodium chloride 0.9 % bolus 1,000 mL (0 mLs Intravenous Stopped 01/17/21 1042)    ED Course  I have reviewed the triage vital signs and the nursing notes.  Pertinent labs & imaging results that were available during my care of the patient were reviewed by me and considered in my medical decision making (see chart for details).    MDM Rules/Calculators/A&P                          CT scan shows chronic fibroid findings but no obvious ureteral stone, diverticulitis, etc.  It does show a sizable left ovarian cyst.  She has had this before.  Given her painful complaints I ordered an ultrasound which does not show any obvious torsion.  However she has not been having severe pain today.  We discussed that at any point it could cause torsion and if she gets any new or worsening symptoms she needs to return to the ER.  Otherwise needs a follow-up with her OB/GYN. Final Clinical Impression(s) / ED Diagnoses Final diagnoses:  Left lower quadrant abdominal pain  Left ovarian cyst    Rx / DC Orders ED Discharge Orders     None        Sherwood Gambler, MD 01/17/21 1414

## 2021-01-17 NOTE — ED Notes (Signed)
Pt ambulatory to waiting room. Pt verbalized understanding of discharge instructions.   

## 2021-01-21 ENCOUNTER — Emergency Department (HOSPITAL_BASED_OUTPATIENT_CLINIC_OR_DEPARTMENT_OTHER): Payer: 59

## 2021-01-21 ENCOUNTER — Encounter (HOSPITAL_BASED_OUTPATIENT_CLINIC_OR_DEPARTMENT_OTHER): Payer: Self-pay | Admitting: *Deleted

## 2021-01-21 ENCOUNTER — Other Ambulatory Visit: Payer: Self-pay

## 2021-01-21 DIAGNOSIS — R079 Chest pain, unspecified: Secondary | ICD-10-CM | POA: Insufficient documentation

## 2021-01-21 DIAGNOSIS — Z5321 Procedure and treatment not carried out due to patient leaving prior to being seen by health care provider: Secondary | ICD-10-CM | POA: Insufficient documentation

## 2021-01-21 DIAGNOSIS — N939 Abnormal uterine and vaginal bleeding, unspecified: Secondary | ICD-10-CM | POA: Insufficient documentation

## 2021-01-21 DIAGNOSIS — R1084 Generalized abdominal pain: Secondary | ICD-10-CM | POA: Insufficient documentation

## 2021-01-21 NOTE — ED Triage Notes (Signed)
C/o chest pain x 4 days, seen here Sunday for abd pain

## 2021-01-22 ENCOUNTER — Emergency Department (HOSPITAL_BASED_OUTPATIENT_CLINIC_OR_DEPARTMENT_OTHER): Payer: 59

## 2021-01-22 ENCOUNTER — Encounter (HOSPITAL_BASED_OUTPATIENT_CLINIC_OR_DEPARTMENT_OTHER): Payer: Self-pay

## 2021-01-22 ENCOUNTER — Emergency Department (HOSPITAL_BASED_OUTPATIENT_CLINIC_OR_DEPARTMENT_OTHER)
Admission: EM | Admit: 2021-01-22 | Discharge: 2021-01-22 | Disposition: A | Discharge status: Home / Self Care | Payer: 59 | Attending: Emergency Medicine | Admitting: Emergency Medicine

## 2021-01-22 ENCOUNTER — Emergency Department (HOSPITAL_BASED_OUTPATIENT_CLINIC_OR_DEPARTMENT_OTHER)
Admission: EM | Admit: 2021-01-22 | Discharge: 2021-01-22 | Disposition: A | Discharge status: Home / Self Care | Payer: 59 | Source: Home / Self Care | Attending: Emergency Medicine | Admitting: Emergency Medicine

## 2021-01-22 ENCOUNTER — Other Ambulatory Visit: Payer: Self-pay

## 2021-01-22 DIAGNOSIS — R079 Chest pain, unspecified: Secondary | ICD-10-CM | POA: Insufficient documentation

## 2021-01-22 DIAGNOSIS — R1084 Generalized abdominal pain: Secondary | ICD-10-CM

## 2021-01-22 DIAGNOSIS — N939 Abnormal uterine and vaginal bleeding, unspecified: Secondary | ICD-10-CM | POA: Insufficient documentation

## 2021-01-22 LAB — COMPREHENSIVE METABOLIC PANEL
ALT: 8 U/L (ref 0–44)
AST: 12 U/L — ABNORMAL LOW (ref 15–41)
Albumin: 3.4 g/dL — ABNORMAL LOW (ref 3.5–5.0)
Alkaline Phosphatase: 50 U/L (ref 38–126)
Anion gap: 8 (ref 5–15)
BUN: 13 mg/dL (ref 6–20)
CO2: 25 mmol/L (ref 22–32)
Calcium: 8.4 mg/dL — ABNORMAL LOW (ref 8.9–10.3)
Chloride: 106 mmol/L (ref 98–111)
Creatinine, Ser: 0.56 mg/dL (ref 0.44–1.00)
GFR, Estimated: >60 mL/min (ref >60)
Glucose, Bld: 116 mg/dL — ABNORMAL HIGH (ref 70–99)
Potassium: 3 mmol/L — ABNORMAL LOW (ref 3.5–5.1)
Sodium: 139 mmol/L (ref 135–145)
Total Bilirubin: 0.2 mg/dL — ABNORMAL LOW (ref 0.3–1.2)
Total Protein: 6.5 g/dL (ref 6.5–8.1)

## 2021-01-22 LAB — CBC
HCT: 30 % — ABNORMAL LOW (ref 36.0–46.0)
Hemoglobin: 9.6 g/dL — ABNORMAL LOW (ref 12.0–15.0)
MCH: 27.7 pg (ref 26.0–34.0)
MCHC: 32 g/dL (ref 30.0–36.0)
MCV: 86.5 fL (ref 80.0–100.0)
Platelets: 229 10*3/uL (ref 150–400)
RBC: 3.47 MIL/uL — ABNORMAL LOW (ref 3.87–5.11)
RDW: 15.5 % (ref 11.5–15.5)
WBC: 3.8 10*3/uL — ABNORMAL LOW (ref 4.0–10.5)
nRBC: 0 % (ref 0.0–0.2)

## 2021-01-22 LAB — URINALYSIS, ROUTINE W REFLEX MICROSCOPIC
Bilirubin Urine: NEGATIVE
Glucose, UA: NEGATIVE mg/dL
Leukocytes,Ua: NEGATIVE
Nitrite: NEGATIVE
Specific Gravity, Urine: 1.028 (ref 1.005–1.030)
pH: 6 (ref 5.0–8.0)

## 2021-01-22 LAB — TROPONIN I (HIGH SENSITIVITY)
Troponin I (High Sensitivity): 2 ng/L (ref <18)
Troponin I (High Sensitivity): 2 ng/L (ref <18)

## 2021-01-22 LAB — LIPASE, BLOOD: Lipase: 21 U/L (ref 11–51)

## 2021-01-22 MED ORDER — IOHEXOL 300 MG/ML  SOLN
100.0000 mL | Freq: Once | INTRAMUSCULAR | Status: AC | PRN
  Administered 2021-01-22: 100 mL via INTRAVENOUS

## 2021-01-22 MED ORDER — METHOCARBAMOL 500 MG PO TABS: 500.0000 mg | ORAL_TABLET | Freq: Three times a day (TID) | ORAL | 0 refills | Status: DC | PRN

## 2021-01-22 NOTE — ED Provider Notes (Signed)
[facility] Provider Note   CSN: [id number] Arrival date & time: 01/22/21  0015     History Chief Complaint  Patient presents with   Abdominal Pain   Chest Pain    [facility] is a 45 y.o. female.   Abdominal Pain Associated symptoms: chest pain and vaginal bleeding   Associated symptoms: no diarrhea, no shortness of breath and no vaginal discharge   Chest Pain Associated symptoms: abdominal pain   Associated symptoms: no back pain, no shortness of breath and no weakness   Patient presents with abdominal pain.  Lower abdomen and upper abdomen.  Has been seen in the ER on the 10th for similar symptoms.  Had noncontrast CT scan and ultrasound done at that time.  Had potential ovarian cyst.  Also has history of fibroids.  States pain is worse in the upper abdomen.  States does not feel like previous ovarian torsion she had.  States she had a torsion when she had an ectopic pregnancy.  Pain is dull.  States she cannot take NSAIDs but takes naproxen at home.  States pain medicines do not help.  Also crampy pain.  States she is on her menses.  History of fibroids.  States her hemoglobin is probably low due to her menses.    Past Medical History:  Diagnosis Date   Anemia    Blood transfusion without reported diagnosis    Fibroid    Pica in adults     Patient Active Problem List   Diagnosis Date Noted   Special screening for malignant neoplasms, colon 11/19/2020   Iron deficiency anemia 85/27/7824   History of Helicobacter pylori infection 11/19/2020    History reviewed. No pertinent surgical history.   OB History   No obstetric history on file.     No family history on file.  Social History   Tobacco Use   Smoking status: Never   Smokeless tobacco: Never  Substance Use Topics   Alcohol use: Not Currently   Drug use: Not Currently    Home Medications Prior to Admission medications   Medication Sig Start Date End Date  Taking? Authorizing Provider  methocarbamol (ROBAXIN) 500 MG tablet Take 1 tablet (500 mg total) by mouth every 8 (eight) hours as needed for muscle spasms. 01/22/21  Yes Davonna Belling, MD  albuterol (VENTOLIN HFA) 108 (90 Base) MCG/ACT inhaler Inhale 1 puff into the lungs every 6 (six) hours as needed for wheezing or shortness of breath.  05/07/20   [provider]  famotidine (PEPCID) 40 MG tablet TAKE 1 TABLET BY MOUTH TWICE A DAY 01/12/21   Zehr, Laban Emperor, PA-C  naproxen (NAPROSYN) 375 MG tablet Take 1 tablet (375 mg total) by mouth 2 (two) times daily with a meal. 11/21/20   Harris, Abigail, PA-C  omeprazole (PRILOSEC) 20 MG capsule Take 40 mg by mouth daily. 05/07/20   [provider]    Allergies    Iron dextran  Review of Systems   Review of Systems  Constitutional:  Negative for appetite change.  Respiratory:  Negative for shortness of breath.   Cardiovascular:  Positive for chest pain.  Gastrointestinal:  Positive for abdominal pain. Negative for diarrhea and rectal pain.  Genitourinary:  Positive for vaginal bleeding. Negative for vaginal discharge and vaginal pain.  Musculoskeletal:  Negative for back pain.  Neurological:  Negative for weakness.   Physical Exam Updated Vital Signs BP 118/84 (BP Location: Right Arm)   Pulse 67  Temp 98.7 F (37.1 C) (Oral)   Resp 18   Ht 5\' 7"  (1.702 m)   Wt 86.2 kg   LMP 01/21/2021   SpO2 100%   BMI 29.76 kg/m   Physical Exam Vitals and nursing note reviewed.  Constitutional:      Appearance: She is well-developed.  HENT:     Head: Normocephalic.  Cardiovascular:     Rate and Rhythm: Normal rate.  Pulmonary:     Breath sounds: Normal breath sounds.  Abdominal:     Hernia: No hernia is present.     Comments: Diffuse tenderness.  Tenderness of both upper and lower abdomen.  No rebound or guarding.  No hernias palpated.  Skin:    General: Skin is warm.     Capillary Refill: Capillary refill takes less  than 2 seconds.  Neurological:     Mental Status: She is alert and oriented to person, place, and time.    ED Results / Procedures / Treatments   Labs (all labs ordered are listed, but only abnormal results are displayed) Labs Reviewed  CBC - Abnormal; Notable for the following components:      Result Value   WBC 3.8 (*)    RBC 3.47 (*)    Hemoglobin 9.6 (*)    HCT 30.0 (*)    All other components within normal limits  COMPREHENSIVE METABOLIC PANEL - Abnormal; Notable for the following components:   Potassium 3.0 (*)    Glucose, Bld 116 (*)    Calcium 8.4 (*)    Albumin 3.4 (*)    AST 12 (*)    Total Bilirubin 0.2 (*)    All other components within normal limits  URINALYSIS, ROUTINE W REFLEX MICROSCOPIC - Abnormal; Notable for the following components:   Hgb urine dipstick LARGE (*)    Ketones, ur TRACE (*)    Protein, ur TRACE (*)    Bacteria, UA RARE (*)    All other components within normal limits  LIPASE, BLOOD  TROPONIN I (HIGH SENSITIVITY)  TROPONIN I (HIGH SENSITIVITY)    EKG None  Radiology DG Chest 2 View  Result Date: 01/22/2021 CLINICAL DATA:  Chest pain for several days EXAM: CHEST - 2 VIEW COMPARISON:  06/01/2020 FINDINGS: Cardiac shadow is within normal limits. The lungs are well aerated bilaterally. No focal infiltrate or sizable effusion is seen. No acute bony abnormality is noted. IMPRESSION: No active cardiopulmonary disease. Electronically Signed   By: Inez Catalina M.D.   On: 01/22/2021 00:24   CT ABDOMEN PELVIS W CONTRAST  Result Date: 01/22/2021 CLINICAL DATA:  45 year old female with left lower quadrant abdominal pain. EXAM: CT ABDOMEN AND PELVIS WITH CONTRAST TECHNIQUE: Multidetector CT imaging of the abdomen and pelvis was performed using the standard protocol following bolus administration of intravenous contrast. CONTRAST:  173mL OMNIPAQUE IOHEXOL 300 MG/ML  SOLN COMPARISON:  CT abdomen pelvis dated 01/17/2021. FINDINGS: Lower chest: The  visualized lung bases are clear. No intra-abdominal free air or free fluid. Hepatobiliary: No focal liver abnormality is seen. No gallstones, gallbladder wall thickening, or biliary dilatation. Pancreas: Unremarkable. No pancreatic ductal dilatation or surrounding inflammatory changes. Spleen: Normal in size without focal abnormality. Adrenals/Urinary Tract: The adrenal glands are unremarkable. There is no hydronephrosis on either side. There is symmetric enhancement and excretion of contrast by both kidneys. Small left renal cysts. The visualized ureters appear unremarkable the urinary bladder is minimally distended and grossly unremarkable. Stomach/Bowel: There is no bowel obstruction or active inflammation. The appendix  is normal. Vascular/Lymphatic: The abdominal aorta and IVC unremarkable. No portal venous gas. There is no adenopathy. Reproductive: The uterus is anteverted. There is a 4.5 cm intracavitary fibroid. There is 6 cm distension of the endometrium with complex fluid content with fluid fluid level likely representing proteinaceous or hemorrhagic fluid. Several additional fibroids including a partially calcified 5 cm right fundal exophytic fibroid. There is a 4.2 cm left ovarian cyst similar to prior CT. Follow-up as per recommendation of the prior ultrasound. Other: None Musculoskeletal: No acute or significant osseous findings. IMPRESSION: 1. Distended endometrial canal with complex, likely hemorrhagic or proteinaceous fluid. Outflow obstruction at the level of the cervix is not excluded. Clinical correlation is recommended. Multiple uterine fibroids including a 4.5 cm intracavitary fibroid. 2. A 4.2 cm left ovarian cyst as seen on the prior CT and ultrasound. Follow-up as per recommendation of the ultrasound of 01/17/2021. 3. No bowel obstruction. Normal appendix. Electronically Signed   By: Anner Crete M.D.   On: 01/22/2021 03:48    Procedures Procedures   Medications Ordered in  ED Medications  iohexol (OMNIPAQUE) 300 MG/ML solution 100 mL (100 mLs Intravenous Contrast Given 01/22/21 0325)    ED Course  I have reviewed the triage vital signs and the nursing notes.  Pertinent labs & imaging results that were available during my care of the patient were reviewed by me and considered in my medical decision making (see chart for details).    MDM Rules/Calculators/A&P                          Patient abdominal pain.  Upper and lower.  Recently seen for same.  Had ovarian cyst and known fibroids.  Also reportedly known H. pylori positive.  Did not have's contrast done with CT scan 5 days ago.  Contrast scan done and showed new fluid in the endometrial canal.  Currently on her menses and this is likely related to that.  States she had been bleeding so I doubt that there is a uterine outlet obstruction.  Does have OB she can follow-up with.  No other cause of abdominal pain seen.  Patient later told me that she had used a muscle stimulator in her abdomen and the pain started a day or 2 after that.  Also potentially could be a cause of some of the diffuse pain.  We will treat with a muscle relaxer.  Discharge home with outpatient follow-up Final Clinical Impression(s) / ED Diagnoses Final diagnoses:  Generalized abdominal pain    Rx / DC Orders ED Discharge Orders          Ordered    methocarbamol (ROBAXIN) 500 MG tablet  Every 8 hours PRN        01/22/21 0408             Davonna Belling, MD 01/22/21 [phone]

## 2021-01-22 NOTE — ED Triage Notes (Signed)
Patient here POV from Home with ABD Pain and CP.  Patient was seen at [facility] for ABD Pain 5 days PTA and states they saw a "Cyst on a CT".  Patient states ABD Pain began on Sunday and CP began on Monday. Symptoms lessened and then became severe again yesterday. Complains of slight dizziness as well.   No N/V/D. No Urinary Symptoms.

## 2021-01-22 NOTE — ED Notes (Signed)
MD made aware of patients available studies from previous visit at ED earlier.

## 2021-01-22 NOTE — ED Notes (Signed)
EKG and Imaging Studies were performed and have resulted from earlier visit at [facility]. Blood Specimens collected at this ED.

## 2021-01-27 ENCOUNTER — Other Ambulatory Visit: Payer: Self-pay | Admitting: Gastroenterology

## 2021-05-19 ENCOUNTER — Encounter (HOSPITAL_BASED_OUTPATIENT_CLINIC_OR_DEPARTMENT_OTHER): Payer: Self-pay

## 2021-05-19 ENCOUNTER — Emergency Department (HOSPITAL_BASED_OUTPATIENT_CLINIC_OR_DEPARTMENT_OTHER)
Admission: EM | Admit: 2021-05-19 | Discharge: 2021-05-19 | Disposition: A | Discharge status: Home / Self Care | Payer: 59 | Attending: Emergency Medicine | Admitting: Emergency Medicine

## 2021-05-19 ENCOUNTER — Other Ambulatory Visit: Payer: Self-pay

## 2021-05-19 ENCOUNTER — Emergency Department (HOSPITAL_BASED_OUTPATIENT_CLINIC_OR_DEPARTMENT_OTHER): Payer: 59

## 2021-05-19 DIAGNOSIS — N83201 Unspecified ovarian cyst, right side: Secondary | ICD-10-CM | POA: Insufficient documentation

## 2021-05-19 DIAGNOSIS — R1031 Right lower quadrant pain: Secondary | ICD-10-CM | POA: Diagnosis present

## 2021-05-19 DIAGNOSIS — D72819 Decreased white blood cell count, unspecified: Secondary | ICD-10-CM | POA: Insufficient documentation

## 2021-05-19 LAB — COMPREHENSIVE METABOLIC PANEL
ALT: 11 U/L (ref 0–44)
AST: 17 U/L (ref 15–41)
Albumin: 3.3 g/dL — ABNORMAL LOW (ref 3.5–5.0)
Alkaline Phosphatase: 56 U/L (ref 38–126)
Anion gap: 6 (ref 5–15)
BUN: 16 mg/dL (ref 6–20)
CO2: 23 mmol/L (ref 22–32)
Calcium: 8.6 mg/dL — ABNORMAL LOW (ref 8.9–10.3)
Chloride: 106 mmol/L (ref 98–111)
Creatinine, Ser: 0.68 mg/dL (ref 0.44–1.00)
GFR, Estimated: >60 mL/min (ref >60)
Glucose, Bld: 94 mg/dL (ref 70–99)
Potassium: 3.5 mmol/L (ref 3.5–5.1)
Sodium: 135 mmol/L (ref 135–145)
Total Bilirubin: 0.3 mg/dL (ref 0.3–1.2)
Total Protein: 6.8 g/dL (ref 6.5–8.1)

## 2021-05-19 LAB — URINALYSIS, ROUTINE W REFLEX MICROSCOPIC
Bilirubin Urine: NEGATIVE
Glucose, UA: NEGATIVE mg/dL
Ketones, ur: NEGATIVE mg/dL
Leukocytes,Ua: NEGATIVE
Nitrite: NEGATIVE
Protein, ur: NEGATIVE mg/dL
Specific Gravity, Urine: 1.03 (ref 1.005–1.030)
pH: 6.5 (ref 5.0–8.0)

## 2021-05-19 LAB — CBC WITH DIFFERENTIAL/PLATELET
Abs Immature Granulocytes: 0 10*3/uL (ref 0.00–0.07)
Basophils Absolute: 0 10*3/uL (ref 0.0–0.1)
Basophils Relative: 1 %
Eosinophils Absolute: 0.1 10*3/uL (ref 0.0–0.5)
Eosinophils Relative: 2 %
HCT: 33.1 % — ABNORMAL LOW (ref 36.0–46.0)
Hemoglobin: 10.2 g/dL — ABNORMAL LOW (ref 12.0–15.0)
Immature Granulocytes: 0 %
Lymphocytes Relative: 41 %
Lymphs Abs: 1.3 10*3/uL (ref 0.7–4.0)
MCH: 26.3 pg (ref 26.0–34.0)
MCHC: 30.8 g/dL (ref 30.0–36.0)
MCV: 85.3 fL (ref 80.0–100.0)
Monocytes Absolute: 0.4 10*3/uL (ref 0.1–1.0)
Monocytes Relative: 13 %
Neutro Abs: 1.3 10*3/uL — ABNORMAL LOW (ref 1.7–7.7)
Neutrophils Relative %: 43 %
Platelets: 225 10*3/uL (ref 150–400)
RBC: 3.88 MIL/uL (ref 3.87–5.11)
RDW: 16 % — ABNORMAL HIGH (ref 11.5–15.5)
WBC: 3.1 10*3/uL — ABNORMAL LOW (ref 4.0–10.5)
nRBC: 0 % (ref 0.0–0.2)

## 2021-05-19 LAB — URINALYSIS, MICROSCOPIC (REFLEX)

## 2021-05-19 LAB — LIPASE, BLOOD: Lipase: 32 U/L (ref 11–51)

## 2021-05-19 LAB — PREGNANCY, URINE: Preg Test, Ur: NEGATIVE

## 2021-05-19 MED ORDER — ONDANSETRON 4 MG PO TBDP: 4.0000 mg | ORAL_TABLET | Freq: Three times a day (TID) | ORAL | 0 refills | Status: DC | PRN

## 2021-05-19 MED ORDER — KETOROLAC TROMETHAMINE 15 MG/ML IJ SOLN
15.0000 mg | Freq: Once | INTRAMUSCULAR | Status: AC
  Administered 2021-05-19: 15 mg via INTRAVENOUS
  Filled 2021-05-19: qty 1

## 2021-05-19 MED ORDER — ONDANSETRON HCL 4 MG/2ML IJ SOLN
4.0000 mg | Freq: Once | INTRAMUSCULAR | Status: AC
  Administered 2021-05-19: 4 mg via INTRAVENOUS
  Filled 2021-05-19: qty 2

## 2021-05-19 MED ORDER — IOHEXOL 300 MG/ML  SOLN
100.0000 mL | Freq: Once | INTRAMUSCULAR | Status: AC | PRN
  Administered 2021-05-19: 100 mL via INTRAVENOUS

## 2021-05-19 NOTE — ED Triage Notes (Signed)
Pt reports nausea and generalized pain for one week, vomited x1 10 days ago.  Still able to eat and drink, but continuous nausea. Took home pregnancy test, negative.

## 2021-05-19 NOTE — Discharge Instructions (Signed)
Recommend taking anti-inflammatory such as naproxen or Motrin for pain control.  Can take the prescribed Zofran as needed for nausea control.  Follow-up with your gastroenterologist as well as gynecologist and primary care doctors.  If you develop worsening pain, vomiting or other new concerning symptom, come back to ER for reassessment.

## 2021-05-19 NOTE — ED Provider Notes (Signed)
[facility] DEPARTMENT Provider Note   CSN: [id number] Arrival date & time: 05/19/21  0758     History Chief Complaint  Patient presents with   Nausea    [facility] is a 45 y.o. female.  Presents to ER with concern for multiple symptoms.  Reports that for the past week she is feeling generally fatigued, weak, nauseated, pain all over.  Pain currently is worse in her lower abdomen, worse on right side, right flank.  Denies pelvic pain.  Is having some mild vaginal bleeding, early for her regular cycle.  Does report history of uterine fibroids.  No vaginal discharge.  No vomiting but is nauseous.  About 10 days ago had an episode of vomiting.  Nonbloody.  Per review of chart, patient has been seen for abdominal pain a couple times over the past few months as well as abnormal uterine bleeding in May. HPI     Past Medical History:  Diagnosis Date   Anemia    Blood transfusion without reported diagnosis    Fibroid    Pica in adults     Patient Active Problem List   Diagnosis Date Noted   Special screening for malignant neoplasms, colon 11/19/2020   Iron deficiency anemia 14/48/1856   History of Helicobacter pylori infection 11/19/2020    History reviewed. No pertinent surgical history.   OB History   No obstetric history on file.     History reviewed. No pertinent family history.  Social History   Tobacco Use   Smoking status: Never   Smokeless tobacco: Never  Substance Use Topics   Alcohol use: Not Currently   Drug use: Not Currently    Home Medications Prior to Admission medications   Medication Sig Start Date End Date Taking? Authorizing Provider  albuterol (VENTOLIN HFA) 108 (90 Base) MCG/ACT inhaler Inhale 1 puff into the lungs every 6 (six) hours as needed for wheezing or shortness of breath.  05/07/20   [provider]  famotidine (PEPCID) 40 MG tablet TAKE 1 TABLET BY MOUTH TWICE A DAY 01/28/21   Zehr, Janett Billow D,  PA-C  methocarbamol (ROBAXIN) 500 MG tablet Take 1 tablet (500 mg total) by mouth every 8 (eight) hours as needed for muscle spasms. 01/22/21   Davonna Belling, MD  naproxen (NAPROSYN) 375 MG tablet Take 1 tablet (375 mg total) by mouth 2 (two) times daily with a meal. 11/21/20   Harris, Abigail, PA-C  omeprazole (PRILOSEC) 20 MG capsule Take 40 mg by mouth daily. 05/07/20   [provider]    Allergies    Iron dextran  Review of Systems   Review of Systems  Constitutional:  Negative for chills and fever.  HENT:  Negative for ear pain and sore throat.   Eyes:  Negative for pain and visual disturbance.  Respiratory:  Negative for cough and shortness of breath.   Cardiovascular:  Negative for chest pain and palpitations.  Gastrointestinal:  Positive for abdominal pain, nausea and vomiting.  Genitourinary:  Negative for dysuria and hematuria.  Musculoskeletal:  Negative for arthralgias and back pain.  Skin:  Negative for color change and rash.  Neurological:  Negative for seizures and syncope.  All other systems reviewed and are negative.  Physical Exam Updated Vital Signs BP 124/74   Pulse 68   Temp 97.9 F (36.6 C) (Oral)   Resp 16   Ht 5\' 7"  (1.702 m)   Wt 88 kg   LMP 04/28/2021 Comment: neg upreg in  er today.  SpO2 99%   BMI 30.38 kg/m   Physical Exam Vitals and nursing note reviewed.  Constitutional:      General: She is not in acute distress.    Appearance: She is well-developed.  HENT:     Head: Normocephalic and atraumatic.  Eyes:     Conjunctiva/sclera: Conjunctivae normal.  Cardiovascular:     Rate and Rhythm: Normal rate and regular rhythm.     Heart sounds: No murmur heard. Pulmonary:     Effort: Pulmonary effort is normal. No respiratory distress.     Breath sounds: Normal breath sounds.  Abdominal:     Palpations: Abdomen is soft.     Tenderness: There is abdominal tenderness.     Comments: Some tenderness to palpation in the right lower  quadrant, no rebound or guarding  Musculoskeletal:     Cervical back: Neck supple.  Skin:    General: Skin is warm and dry.  Neurological:     Mental Status: She is alert.    ED Results / Procedures / Treatments   Labs (all labs ordered are listed, but only abnormal results are displayed) Labs Reviewed  CBC WITH DIFFERENTIAL/PLATELET - Abnormal; Notable for the following components:      Result Value   WBC 3.1 (*)    Hemoglobin 10.2 (*)    HCT 33.1 (*)    RDW 16.0 (*)    Neutro Abs 1.3 (*)    All other components within normal limits  COMPREHENSIVE METABOLIC PANEL - Abnormal; Notable for the following components:   Calcium 8.6 (*)    Albumin 3.3 (*)    All other components within normal limits  URINALYSIS, ROUTINE W REFLEX MICROSCOPIC - Abnormal; Notable for the following components:   Hgb urine dipstick LARGE (*)    All other components within normal limits  URINALYSIS, MICROSCOPIC (REFLEX) - Abnormal; Notable for the following components:   Bacteria, UA RARE (*)    All other components within normal limits  LIPASE, BLOOD  PREGNANCY, URINE    EKG None  Radiology CT ABDOMEN PELVIS W CONTRAST  Result Date: 05/19/2021 CLINICAL DATA:  Right lower quadrant abdominal pain. EXAM: CT ABDOMEN AND PELVIS WITH CONTRAST TECHNIQUE: Multidetector CT imaging of the abdomen and pelvis was performed using the standard protocol following bolus administration of intravenous contrast. CONTRAST:  148mL OMNIPAQUE IOHEXOL 300 MG/ML  SOLN COMPARISON:  CT scan 01/22/2021 FINDINGS: Lower chest: The lung bases are clear of acute process. No pleural effusion or pulmonary lesions. The heart is normal in size. No pericardial effusion. The distal esophagus and aorta are unremarkable. Hepatobiliary: No hepatic lesions or intrahepatic biliary dilatation. Stable calcified granuloma in the right hepatic lobe posteriorly. Gallbladder is unremarkable. No common bile duct dilatation. Pancreas: No mass,  inflammation or ductal dilatation. Spleen: Normal size. No focal lesions. Adrenals/Urinary Tract: The adrenal glands are unremarkable. No worrisome renal lesions or hydronephrosis. Simple left renal cysts. The delayed images do not demonstrate any significant collecting system abnormalities. The bladder is unremarkable. Stomach/Bowel: The stomach, duodenum, small bowel and colon are unremarkable. No acute inflammatory changes, mass lesions obstructive findings. The terminal ileum is normal. The appendix is normal. Vascular/Lymphatic: The aorta and branch vessels are normal. The major venous structures are patent. No mesenteric or retroperitoneal mass or adenopathy. Reproductive: Stable enlarged fibroid uterus. The fundal fibroid is degenerated and demonstrates internal calcifications. The endometrium is displaced to the left but is normal in thickness. There is a simple 3 cm right ovarian  cyst. No follow-up imaging recommended. Note: This recommendation does not apply to premenarchal patients and to those with increased risk (genetic, family history, elevated tumor markers or other high-risk factors) of ovarian cancer. Reference: JACR 2020 Feb; 17(2):248-254 The left ovarian cyst seen on the prior CT scan has resolved. Other: No pelvic mass or adenopathy. Small amount of free pelvic fluid is likely physiologic. A leaking ovarian cyst is also possible. No inguinal mass or adenopathy. No abdominal wall hernia or subcutaneous lesions. Musculoskeletal: No significant bony findings. IMPRESSION: 1. No acute abdominal/pelvic findings, mass lesions or adenopathy. 2. Stable enlarged fibroid uterus. 3. Simple 3 cm right ovarian cyst. 4. Small amount of free pelvic fluid is likely physiologic. A leaking ovarian cyst is also possible. Electronically Signed   By: Marijo Sanes M.D.   On: 05/19/2021 12:15    Procedures Procedures   Medications Ordered in ED Medications  ondansetron (ZOFRAN) injection 4 mg (4 mg Intravenous  Given 05/19/21 0850)  ketorolac (TORADOL) 15 MG/ML injection 15 mg (15 mg Intravenous Given 05/19/21 0850)  iohexol (OMNIPAQUE) 300 MG/ML solution 100 mL (100 mLs Intravenous Contrast Given 05/19/21 1150)    ED Course  I have reviewed the triage vital signs and the nursing notes.  Pertinent labs & imaging results that were available during my care of the patient were reviewed by me and considered in my medical decision making (see chart for details).    MDM Rules/Calculators/A&P                           45 year old lady presents to ER with concern for generalized nausea, pain, worse in abdomen.  On exam she appears well in no distress.  Did note to have some tenderness in the right lower quadrant.  Lab work noted for mild leukopenia.  Otherwise grossly stable.  CT scan negative for acute abdominal pelvic findings but did demonstrate uterine fibroids and simple right ovarian cyst as well as small pelvic free fluid likely physiologic but could represent leaking ovarian cyst.  Cyst may be source of symptoms.  On reassessment pain and nausea is well controlled.  Believe she is appropriate for outpatient management.  Recommend follow-up with her primary doctor and gynecology.    After the discussed management above, the patient was determined to be safe for discharge.  The patient was in agreement with this plan and all questions regarding their care were answered.  ED return precautions were discussed and the patient will return to the ED with any significant worsening of condition.   Final Clinical Impression(s) / ED Diagnoses Final diagnoses:  Cyst of right ovary    Rx / DC Orders ED Discharge Orders     None        Lucrezia Starch, MD 05/20/21 [phone]

## 2021-05-19 NOTE — ED Notes (Signed)
Pt states nausea improving, feels slightly better, reminded of need for urine specimen.  Resting quietly with eyes closed.  No vomiting noted

## 2021-06-07 DIAGNOSIS — B9689 Other specified bacterial agents as the cause of diseases classified elsewhere: Secondary | ICD-10-CM | POA: Insufficient documentation

## 2021-06-07 DIAGNOSIS — N898 Other specified noninflammatory disorders of vagina: Secondary | ICD-10-CM | POA: Insufficient documentation

## 2021-06-24 DIAGNOSIS — N926 Irregular menstruation, unspecified: Secondary | ICD-10-CM | POA: Insufficient documentation

## 2021-10-24 ENCOUNTER — Encounter (HOSPITAL_BASED_OUTPATIENT_CLINIC_OR_DEPARTMENT_OTHER): Payer: Self-pay | Admitting: Urology

## 2021-10-24 ENCOUNTER — Emergency Department (HOSPITAL_BASED_OUTPATIENT_CLINIC_OR_DEPARTMENT_OTHER)
Admission: EM | Admit: 2021-10-24 | Discharge: 2021-10-24 | Disposition: A | Discharge status: Home / Self Care | Payer: 59 | Attending: Emergency Medicine | Admitting: Emergency Medicine

## 2021-10-24 ENCOUNTER — Emergency Department (HOSPITAL_BASED_OUTPATIENT_CLINIC_OR_DEPARTMENT_OTHER): Payer: 59

## 2021-10-24 ENCOUNTER — Other Ambulatory Visit: Payer: Self-pay

## 2021-10-24 DIAGNOSIS — R0981 Nasal congestion: Secondary | ICD-10-CM | POA: Diagnosis not present

## 2021-10-24 DIAGNOSIS — R059 Cough, unspecified: Secondary | ICD-10-CM | POA: Insufficient documentation

## 2021-10-24 DIAGNOSIS — R0602 Shortness of breath: Secondary | ICD-10-CM | POA: Diagnosis not present

## 2021-10-24 MED ORDER — PREDNISONE 10 MG PO TABS: 40.0000 mg | ORAL_TABLET | Freq: Every day | ORAL | 0 refills | Status: AC

## 2021-10-24 NOTE — ED Provider Notes (Signed)
?[facility] ?Provider Note ? ? ?CSN: [id number] ?Arrival date & time: 10/24/21  2100 ? ?  ? ?History ? ?Chief Complaint  ?Patient presents with  ? Shortness of Breath  ? ? ?Angel Munoz is a 46 y.o. female. ? ?HPI ?Patient is a 46 year old female who presents to the emergency department due to a dry cough as well as shortness of breath.  Patient states that she began coughing about 1 week ago.  Reports associated rhinorrhea and congestion.  About 2 days ago she became more short of breath.  States her symptoms are intermittent.  Denies any sore throat, chest pain, nausea, vomiting.  Denies a history of asthma but states that she does have an albuterol inhaler at home which she has been using as needed without any significant relief.  Denies any unilateral leg swelling, hemoptysis, recent surgery/trauma, history of blood clot, hormone use. ?  ? ?Home Medications ?Prior to Admission medications   ?Medication Sig Start Date End Date Taking? Authorizing Provider  ?predniSONE (DELTASONE) 10 MG tablet Take 4 tablets (40 mg total) by mouth daily with breakfast for 4 days. 10/24/21 10/28/21 Yes Rayna Sexton, PA-C  ?albuterol (VENTOLIN HFA) 108 (90 Base) MCG/ACT inhaler Inhale 1 puff into the lungs every 6 (six) hours as needed for wheezing or shortness of breath.  05/07/20   [provider]  ?famotidine (PEPCID) 40 MG tablet TAKE 1 TABLET BY MOUTH TWICE A DAY 01/28/21   Zehr, Janett Billow D, PA-C  ?methocarbamol (ROBAXIN) 500 MG tablet Take 1 tablet (500 mg total) by mouth every 8 (eight) hours as needed for muscle spasms. 01/22/21   Davonna Belling, MD  ?naproxen (NAPROSYN) 375 MG tablet Take 1 tablet (375 mg total) by mouth 2 (two) times daily with a meal. 11/21/20   Margarita Mail, PA-C  ?omeprazole (PRILOSEC) 20 MG capsule Take 40 mg by mouth daily. 05/07/20   [provider]  ?ondansetron (ZOFRAN ODT) 4 MG disintegrating tablet Take 1 tablet (4 mg total) by mouth  every 8 (eight) hours as needed for nausea or vomiting. 05/19/21   Lucrezia Starch, MD  ?   ? ?Allergies    ?Iron dextran   ? ?Review of Systems   ?Review of Systems  ?All other systems reviewed and are negative. ?Ten systems reviewed and are negative for acute change, except as noted in the HPI.   ?Physical Exam ?Updated Vital Signs ?BP (!) 146/85 (BP Location: Right Arm)   Pulse 79   Temp 98 ?F (36.7 ?C) (Oral)   Resp 13   Ht '5\' 7"'$  (1.702 m)   Wt 88 kg   LMP 10/09/2021 (Approximate)   SpO2 98% Comment: while ambulating  BMI 30.39 kg/m?  ?Physical Exam ?Vitals and nursing note reviewed.  ?Constitutional:   ?   General: She is not in acute distress. ?   Appearance: Normal appearance. She is not ill-appearing, toxic-appearing or diaphoretic.  ?HENT:  ?   Head: Normocephalic and atraumatic.  ?   Right Ear: External ear normal.  ?   Left Ear: External ear normal.  ?   Nose: Nose normal.  ?   Mouth/Throat:  ?   Mouth: Mucous membranes are moist.  ?   Pharynx: Oropharynx is clear. No oropharyngeal exudate or posterior oropharyngeal erythema.  ?Eyes:  ?   Extraocular Movements: Extraocular movements intact.  ?Cardiovascular:  ?   Rate and Rhythm: Normal rate and regular rhythm.  ?   Pulses: Normal pulses.  ?  Heart sounds: Normal heart sounds. No murmur heard. ?  No friction rub. No gallop.  ?Pulmonary:  ?   Effort: Pulmonary effort is normal. No respiratory distress.  ?   Breath sounds: Normal breath sounds. No stridor. No wheezing, rhonchi or rales.  ?Abdominal:  ?   General: Abdomen is flat.  ?   Tenderness: There is no abdominal tenderness.  ?Musculoskeletal:     ?   General: Normal range of motion.  ?   Cervical back: Normal range of motion and neck supple. No tenderness.  ?   Right lower leg: No tenderness. No edema.  ?   Left lower leg: No tenderness. No edema.  ?   Comments: No pedal edema.  No calf tenderness.  ?Skin: ?   General: Skin is warm and dry.  ?Neurological:  ?   General: No focal deficit  present.  ?   Mental Status: She is alert and oriented to person, place, and time.  ?Psychiatric:     ?   Mood and Affect: Mood normal.     ?   Behavior: Behavior normal.  ? ?ED Results / Procedures / Treatments   ?Labs ?(all labs ordered are listed, but only abnormal results are displayed) ?Labs Reviewed - No data to display ? ?EKG ?EKG Interpretation ? ?Date/Time:  Sunday October 24 2021 21:12:04 EDT ?Ventricular Rate:  83 ?PR Interval:  162 ?QRS Duration: 78 ?QT Interval:  368 ?QTC Calculation: 433 ?R Axis:   58 ?Text Interpretation: Sinus rhythm Anteroseptal infarct, old No significant change since last tracing Confirmed by Wandra Arthurs [id number]) on 10/24/2021 9:13:33 PM ? ?Radiology ?DG Chest Portable 1 View ? ?Result Date: 10/24/2021 ?CLINICAL DATA:  Shortness of breath. EXAM: PORTABLE CHEST 1 VIEW COMPARISON:  January 21, 2021 FINDINGS: The heart size and mediastinal contours are within normal limits. Both lungs are clear. The visualized skeletal structures are unremarkable. IMPRESSION: No active disease. Electronically Signed   By: Virgina Norfolk M.D.   On: 10/24/2021 22:13   ? ?Procedures ?Procedures  ? ?Medications Ordered in ED ?Medications - No data to display ? ?ED Course/ Medical Decision Making/ A&P ?Clinical Course as of 10/24/21 2253  ?Sun Oct 24, 2021  ?2113 Patient ambulated by respiratory with pulse ox.  Oxygen saturation stayed above 98% on room air. [LJ]  ?  ?Clinical Course User Index ?[LJ] Rayna Sexton, PA-C  ? ?                        ?Medical Decision Making ?Amount and/or Complexity of Data Reviewed ?Radiology: ordered. ? ?Risk ?Prescription drug management. ? ?Patient is a 46 year old female who presents to the emergency department due to shortness of breath, dry cough, rhinorrhea, as well as congestion. ? ?On my exam patient's heart is regular rate and rhythm without murmurs, rubs, or gallops.  Lungs are clear to auscultation bilaterally.  No pedal edema.  Patient afebrile, nontachycardic,  and nontoxic-appearing.  PERC negative.  ECG shows no significant changes/tracing.  Chest x-ray shows no active disease.  Doubt DVT/PE at this time. ? ?Patient ambulated by respiratory staff and her oxygen saturation stayed above 98% on room air. ? ?After further discussion she now notes that she does have a history of seasonal allergies.  She has been taking Claritin without significant relief.  Recommended that she also begin Flonase.  Continue using her albuterol inhaler as needed.  We will also prescribe patient a short course of prednisone.  Denies history of diabetes mellitus. ? ?Patient appears stable for discharge at this time and she is agreeable.  She states she has a follow-up appointment with her PCP next week.  Discussed return precautions.  Her questions were answered and she was amicable at the time of discharge. ?Final Clinical Impression(s) / ED Diagnoses ?Final diagnoses:  ?SOB (shortness of breath)  ? ?Rx / DC Orders ?ED Discharge Orders   ? ?      Ordered  ?  predniSONE (DELTASONE) 10 MG tablet  Daily with breakfast       ? 10/24/21 2245  ? ?  ?  ? ?  ? ? ?  ?Rayna Sexton, PA-C ?10/24/21 2255 ? ?  ?Drenda Freeze, MD ?10/25/21 1453 ? ?

## 2021-10-24 NOTE — ED Notes (Signed)
O2 sat=98% on RA while ambulating. ?

## 2021-10-24 NOTE — ED Triage Notes (Signed)
Cough x 1 week, states SOB x 2 days  ?Using inhaler and it isn't helping much  ?Denies fever  ?No h/o asthma ?NAD at this time ?

## 2021-10-24 NOTE — Discharge Instructions (Addendum)
I am prescribing you a strong steroid medication called prednisone.  Please only take this as prescribed.  Please take it early in the morning, as this medication can be stimulating and make it difficult to sleep at night.  This medication will hopefully help with your cough and shortness of breath.  Please continue to take your albuterol inhaler as needed for your shortness of breath and wheezing. ? ?I would also recommend you begin taking Flonase.  This is also known as fluticasone.  Please purchase this over-the-counter.  You can spray this into both nostrils twice per day.  This will help with your congestion and postnasal drip. ? ?Please follow-up with your regular provider regarding your symptoms as well as this visit today.  If you develop any new or worsening symptoms please come back to the emergency department. ?

## 2021-10-25 ENCOUNTER — Telehealth (HOSPITAL_BASED_OUTPATIENT_CLINIC_OR_DEPARTMENT_OTHER): Payer: Self-pay | Admitting: Emergency Medicine

## 2021-10-25 MED ORDER — BENZONATATE 100 MG PO CAPS: 100.0000 mg | ORAL_CAPSULE | Freq: Three times a day (TID) | ORAL | 0 refills | Status: DC

## 2021-10-25 NOTE — Telephone Encounter (Signed)
Sent in cough medicine ? ?

## 2021-10-26 ENCOUNTER — Ambulatory Visit: Payer: BLUE CROSS/BLUE SHIELD | Admitting: Family

## 2021-11-03 ENCOUNTER — Other Ambulatory Visit: Payer: Self-pay | Admitting: Obstetrics and Gynecology

## 2021-11-05 ENCOUNTER — Other Ambulatory Visit: Payer: Self-pay | Admitting: Obstetrics and Gynecology

## 2021-11-05 DIAGNOSIS — D649 Anemia, unspecified: Secondary | ICD-10-CM

## 2021-11-11 ENCOUNTER — Ambulatory Visit (HOSPITAL_COMMUNITY): Payer: BLUE CROSS/BLUE SHIELD

## 2021-12-18 ENCOUNTER — Encounter (HOSPITAL_BASED_OUTPATIENT_CLINIC_OR_DEPARTMENT_OTHER): Payer: Self-pay

## 2021-12-18 ENCOUNTER — Emergency Department (HOSPITAL_BASED_OUTPATIENT_CLINIC_OR_DEPARTMENT_OTHER)
Admission: EM | Admit: 2021-12-18 | Discharge: 2021-12-18 | Disposition: A | Discharge status: Home / Self Care | Payer: BLUE CROSS/BLUE SHIELD | Attending: Emergency Medicine | Admitting: Emergency Medicine

## 2021-12-18 ENCOUNTER — Other Ambulatory Visit: Payer: Self-pay

## 2021-12-18 DIAGNOSIS — N898 Other specified noninflammatory disorders of vagina: Secondary | ICD-10-CM | POA: Diagnosis present

## 2021-12-18 HISTORY — DX: Anxiety disorder, unspecified: F41.9

## 2021-12-18 LAB — WET PREP, GENITAL
Clue Cells Wet Prep HPF POC: NONE SEEN
Sperm: NONE SEEN
Trich, Wet Prep: NONE SEEN
WBC, Wet Prep HPF POC: <10 (ref <10)
Yeast Wet Prep HPF POC: NONE SEEN

## 2021-12-18 LAB — URINALYSIS, ROUTINE W REFLEX MICROSCOPIC
Bilirubin Urine: NEGATIVE
Glucose, UA: NEGATIVE mg/dL
Hgb urine dipstick: NEGATIVE
Ketones, ur: NEGATIVE mg/dL
Leukocytes,Ua: NEGATIVE
Nitrite: NEGATIVE
Protein, ur: NEGATIVE mg/dL
Specific Gravity, Urine: 1.025 (ref 1.005–1.030)
pH: 6 (ref 5.0–8.0)

## 2021-12-18 LAB — PREGNANCY, URINE: Preg Test, Ur: NEGATIVE

## 2021-12-18 NOTE — Discharge Instructions (Signed)
Your labs were all normal.  There is no signs of infection in your urine or on your wet prep.  We did send out a gonorrhea chlamydia which should come back in a couple of days, however suspect that this will be normal.  If this happens again, please follow-up with your OB/GYN.

## 2021-12-18 NOTE — ED Notes (Addendum)
Pt walked out before final vital signs or discharge signature could be obtained.

## 2021-12-18 NOTE — ED Provider Notes (Signed)
[facility] Provider Note   CSN: [id number] Arrival date & time: 12/18/21  1216     History  Chief Complaint  Patient presents with   Vaginal Discharge    Angel Munoz is a 46 y.o. female who presents to the emergency department for evaluation of sudden onset of orange discharge that occurred while getting dressed today.  Patient states that she felt her underwear get wet and when she looked she had copious amounts of orange discharge.  Her husband also reviewed and confirms this.  Since then, she has not had any additional episodes.  She notes that a few days ago, she had pain in her right lower back that has since resolved.  She denies vaginal itching, vaginal pain, urinary symptoms, abdominal pain, nausea, vomiting and diarrhea.  She states that she was treated for BV about 1 month ago by her OB/GYN and took only 3 out of the 5 metronidazole applicators.   Vaginal Discharge      Home Medications Prior to Admission medications   Medication Sig Start Date End Date Taking? Authorizing Provider  albuterol (VENTOLIN HFA) 108 (90 Base) MCG/ACT inhaler Inhale 1 puff into the lungs every 6 (six) hours as needed for wheezing or shortness of breath.  05/07/20   [provider]  benzonatate (TESSALON) 100 MG capsule Take 1 capsule (100 mg total) by mouth every 8 (eight) hours. 10/25/21   Curatolo, Adam, DO  famotidine (PEPCID) 40 MG tablet TAKE 1 TABLET BY MOUTH TWICE A DAY 01/28/21   Zehr, Janett Billow D, PA-C  methocarbamol (ROBAXIN) 500 MG tablet Take 1 tablet (500 mg total) by mouth every 8 (eight) hours as needed for muscle spasms. 01/22/21   Davonna Belling, MD  naproxen (NAPROSYN) 375 MG tablet Take 1 tablet (375 mg total) by mouth 2 (two) times daily with a meal. 11/21/20   Harris, Abigail, PA-C  omeprazole (PRILOSEC) 20 MG capsule Take 40 mg by mouth daily. 05/07/20   [provider]  ondansetron (ZOFRAN ODT) 4 MG disintegrating  tablet Take 1 tablet (4 mg total) by mouth every 8 (eight) hours as needed for nausea or vomiting. 05/19/21   Lucrezia Starch, MD      Allergies    Iron dextran    Review of Systems   Review of Systems  Genitourinary:  Positive for vaginal discharge.    Physical Exam Updated Vital Signs BP 112/90   Pulse 72   Temp 98.2 F (36.8 C) (Oral)   Resp 18   Ht '5\' 7"'$  (1.702 m)   Wt 89.8 kg   SpO2 100%   BMI 31.01 kg/m  Physical Exam Vitals and nursing note reviewed. Exam conducted with a chaperone present.  Constitutional:      General: She is not in acute distress.    Appearance: She is not ill-appearing.  HENT:     Head: Atraumatic.  Eyes:     Conjunctiva/sclera: Conjunctivae normal.  Cardiovascular:     Rate and Rhythm: Normal rate and regular rhythm.     Pulses: Normal pulses.     Heart sounds: No murmur heard. Pulmonary:     Effort: Pulmonary effort is normal. No respiratory distress.     Breath sounds: Normal breath sounds.  Abdominal:     General: Abdomen is flat. There is no distension.     Palpations: Abdomen is soft.     Tenderness: There is no abdominal tenderness.  Genitourinary:    General: Normal vulva.  Labia:        Right: No rash.        Left: No rash.      Vagina: Normal. No vaginal discharge, tenderness or lesions.     Cervix: No cervical motion tenderness.  Musculoskeletal:        General: Normal range of motion.     Cervical back: Normal range of motion.  Skin:    General: Skin is warm and dry.     Capillary Refill: Capillary refill takes less than 2 seconds.  Neurological:     General: No focal deficit present.     Mental Status: She is alert.  Psychiatric:        Mood and Affect: Mood normal.     ED Results / Procedures / Treatments   Labs (all labs ordered are listed, but only abnormal results are displayed) Labs Reviewed  WET PREP, GENITAL  PREGNANCY, URINE  URINALYSIS, ROUTINE W REFLEX MICROSCOPIC  GC/CHLAMYDIA PROBE AMP  ([place]) NOT AT [facility]    EKG None  Radiology No results found.  Procedures Procedures    Medications Ordered in ED Medications - No data to display  ED Course/ Medical Decision Making/ A&P                           Medical Decision Making Amount and/or Complexity of Data Reviewed Labs: ordered.   Social determinants of health:  Social History   Socioeconomic History   Marital status: Married    Spouse name: Not on file   Number of children: Not on file   Years of education: Not on file   Highest education level: Not on file  Occupational History   Not on file  Tobacco Use   Smoking status: Never   Smokeless tobacco: Never  Substance and Sexual Activity   Alcohol use: Not Currently   Drug use: Not Currently   Sexual activity: Not Currently  Other Topics Concern   Not on file  Social History Narrative   Not on file   Social Determinants of Health   Financial Resource Strain: Not on file  Food Insecurity: Not on file  Transportation Needs: Not on file  Physical Activity: Not on file  Stress: Not on file  Social Connections: Not on file  Intimate Partner Violence: Not on file     Initial impression:  This patient presents to the ED for concern of vaginal discharge, this involves an extensive number of treatment options, and is a complaint that carries with it a high risk of complications and morbidity.   Differentials include BV, yeast infection, STD, UTI, kidney infection.   Comorbidities affecting care:  None  Additional history obtained: Husband  Lab Tests  I Ordered, reviewed, and interpreted labs and EKG.  The pertinent results include:  Wet prep normal Urine normal Pending GC chlamydia   ED Course/Re-evaluation: 46 year old female presents to the ED for evaluation of discolored discharge that occurred earlier today.  Differentials include BV, yeast infection, UTI, STD.  Abdomen is soft, nondistended and nontender to palpation.   Negative CVA tenderness bilaterally.  GU exam unremarkable.  Wet prep normal, urine normal.  Pending GC chlamydia.  Overall, patient's clinical exam and work-up was without any acute findings.  I am not quite sure what caused her symptoms, however suggest that she follow-up with her OB/GYN if symptoms occur again.  Patient expressed understanding is amenable to plan.  Disposition:  After consideration of  the diagnostic results, physical exam, history and the patients response to treatment feel that the patent would benefit from discharge.   Vaginal discharge: Plan and management as described above. Discharged home in good condition.   Final Clinical Impression(s) / ED Diagnoses Final diagnoses:  Vaginal discharge    Rx / DC Orders ED Discharge Orders     None         Tonye Pearson, PA-C 12/18/21 [place], [place], DO 12/22/21 [phone]

## 2021-12-18 NOTE — ED Triage Notes (Addendum)
Pt reports she was getting dressed today and felt underwear get wet and noticed orange discharge. Reports pain in right lower side and right groin. Feels like palpitations due to anxiety. Pt reports that she was seen by OB/gyn a month ago and treated for BV but did not complete. Pt feels dehydrated, constipated. Noticed odor last week

## 2021-12-20 LAB — GC/CHLAMYDIA PROBE AMP (~~LOC~~) NOT AT ARMC
Chlamydia: NEGATIVE
Comment: NEGATIVE
Comment: NORMAL
Neisseria Gonorrhea: NEGATIVE

## 2022-04-18 ENCOUNTER — Other Ambulatory Visit: Payer: Self-pay | Admitting: Obstetrics and Gynecology

## 2022-04-19 ENCOUNTER — Other Ambulatory Visit: Payer: Self-pay | Admitting: Obstetrics and Gynecology

## 2022-04-19 DIAGNOSIS — D649 Anemia, unspecified: Secondary | ICD-10-CM

## 2022-04-25 ENCOUNTER — Non-Acute Institutional Stay (HOSPITAL_COMMUNITY)
Admission: RE | Admit: 2022-04-25 | Discharge: 2022-04-25 | Disposition: A | Discharge status: Home / Self Care | Payer: BLUE CROSS/BLUE SHIELD | Source: Ambulatory Visit | Attending: Internal Medicine | Admitting: Internal Medicine

## 2022-04-25 DIAGNOSIS — D649 Anemia, unspecified: Secondary | ICD-10-CM

## 2022-04-25 MED ORDER — SODIUM CHLORIDE 0.9 % IV SOLN
500.0000 mg | INTRAVENOUS | Status: DC
  Administered 2022-04-25: 500 mg via INTRAVENOUS
  Filled 2022-04-25: qty 25

## 2022-04-25 MED ORDER — ACETAMINOPHEN 500 MG PO TABS
500.0000 mg | ORAL_TABLET | ORAL | Status: DC
  Administered 2022-04-25: 500 mg via ORAL
  Filled 2022-04-25: qty 1

## 2022-04-25 MED ORDER — DIPHENHYDRAMINE HCL 25 MG PO CAPS
25.0000 mg | ORAL_CAPSULE | ORAL | Status: DC
  Administered 2022-04-25: 25 mg via ORAL
  Filled 2022-04-25: qty 1

## 2022-04-25 MED ORDER — SODIUM CHLORIDE 0.9 % IV SOLN: INTRAVENOUS | Status: DC | PRN

## 2022-04-25 NOTE — Progress Notes (Signed)
PATIENT CARE CENTER NOTE   Diagnosis: Anemia, unspecified type [D64.9]   Provider: Tiana Loft, MD   Procedure: Venofer infusion    Note:   Patient received  Venofer 500 mg infusion (dose # 1 of 2) via PIV. Pre-medications given (Tylenol and Benadryl) per order. Patient tolerated infusion well with no adverse reaction. IV site changed 1 hours before end of infusion because patient reported soreness at site. IV sites showed no signs of infiltration. Vital signs stable. Discharge instructions given. Patient to come back in 2 weeks for second infusion and will schedule next appointment at the front desk. Patient alert, oriented and ambulatory at discharge.

## 2022-05-10 ENCOUNTER — Telehealth: Payer: Self-pay

## 2022-05-10 NOTE — Telephone Encounter (Signed)
Pt and her obgyn office have been trying to reach office to change her infusion date. Pt would like to come in Friday instead of this Thursday. Pt was about to into a meeting and will call back to schedule . [place]

## 2022-05-11 ENCOUNTER — Other Ambulatory Visit: Payer: Self-pay

## 2022-05-11 ENCOUNTER — Other Ambulatory Visit (HOSPITAL_COMMUNITY): Payer: Self-pay

## 2022-05-12 ENCOUNTER — Encounter (HOSPITAL_COMMUNITY): Payer: BLUE CROSS/BLUE SHIELD

## 2022-05-12 ENCOUNTER — Telehealth: Payer: Self-pay | Admitting: Pharmacy Technician

## 2022-05-12 NOTE — Telephone Encounter (Signed)
Auth Submission: NO AUTH NEEDED Payer: [employer] Medication & CPT/J Code(s) submitted: Venofer (Iron Sucrose) J1756 Route of submission (phone, fax, portal):  Phone # Fax # Auth type: Buy/Bill Units/visits requested: X1 DOSE Reference number:  Approval from: 05/12/22 to 07/10/22

## 2022-05-17 ENCOUNTER — Ambulatory Visit (INDEPENDENT_AMBULATORY_CARE_PROVIDER_SITE_OTHER): Payer: BLUE CROSS/BLUE SHIELD

## 2022-05-17 VITALS — BP 123/75 | HR 69 | Temp 97.6°F | Resp 18 | Ht 68.0 in

## 2022-05-17 DIAGNOSIS — D509 Iron deficiency anemia, unspecified: Secondary | ICD-10-CM | POA: Diagnosis not present

## 2022-05-17 MED ORDER — DIPHENHYDRAMINE HCL 25 MG PO CAPS
25.0000 mg | ORAL_CAPSULE | Freq: Once | ORAL | Status: AC
  Administered 2022-05-17: 25 mg via ORAL
  Filled 2022-05-17: qty 1

## 2022-05-17 MED ORDER — ACETAMINOPHEN 325 MG PO TABS
650.0000 mg | ORAL_TABLET | Freq: Once | ORAL | Status: AC
  Administered 2022-05-17: 650 mg via ORAL
  Filled 2022-05-17: qty 2

## 2022-05-17 MED ORDER — SODIUM CHLORIDE 0.9 % IV SOLN
500.0000 mg | Freq: Once | INTRAVENOUS | Status: AC
  Administered 2022-05-17: 500 mg via INTRAVENOUS
  Filled 2022-05-17: qty 23.33

## 2022-05-17 NOTE — Progress Notes (Signed)
Patient called approximately 3:55pm complaining of swelling in hands and feet. Patient denies any other symptoms such as sob/chest pain/itching/rash/ throat swelling. Advised to monitor symptoms and if it worsens to go to ED for evaluation. Patient verbalized understanding.

## 2022-05-17 NOTE — Progress Notes (Signed)
Diagnosis: Iron Deficiency Anemia  Provider:  Marshell Garfinkel MD  Procedure: Infusion  IV Type: Peripheral, IV Location: R Antecubital  Venofer (Iron Sucrose), Dose: 500 mg  Infusion Start Time: 1000  Infusion Stop Time: 1848  Post Infusion IV Care: Patient declined observation and Peripheral IV Discontinued  Discharge: Condition: Good, Destination: Home . AVS provided to patient.   Performed by:  Koren Shiver, RN

## 2022-05-18 ENCOUNTER — Encounter (HOSPITAL_COMMUNITY): Payer: BLUE CROSS/BLUE SHIELD

## 2022-05-30 DIAGNOSIS — N761 Subacute and chronic vaginitis: Secondary | ICD-10-CM | POA: Insufficient documentation

## 2022-06-06 ENCOUNTER — Encounter: Payer: Self-pay | Admitting: Pulmonary Disease

## 2022-06-22 ENCOUNTER — Emergency Department (HOSPITAL_BASED_OUTPATIENT_CLINIC_OR_DEPARTMENT_OTHER): Payer: BLUE CROSS/BLUE SHIELD | Admitting: Radiology

## 2022-06-22 ENCOUNTER — Encounter (HOSPITAL_BASED_OUTPATIENT_CLINIC_OR_DEPARTMENT_OTHER): Payer: Self-pay | Admitting: Emergency Medicine

## 2022-06-22 ENCOUNTER — Other Ambulatory Visit: Payer: Self-pay

## 2022-06-22 DIAGNOSIS — K6289 Other specified diseases of anus and rectum: Secondary | ICD-10-CM | POA: Insufficient documentation

## 2022-06-22 DIAGNOSIS — R1012 Left upper quadrant pain: Secondary | ICD-10-CM | POA: Diagnosis not present

## 2022-06-22 DIAGNOSIS — R079 Chest pain, unspecified: Secondary | ICD-10-CM | POA: Insufficient documentation

## 2022-06-22 DIAGNOSIS — R1032 Left lower quadrant pain: Secondary | ICD-10-CM | POA: Diagnosis not present

## 2022-06-22 LAB — CBC
HCT: 32.6 % — ABNORMAL LOW (ref 36.0–46.0)
Hemoglobin: 10.3 g/dL — ABNORMAL LOW (ref 12.0–15.0)
MCH: 27.5 pg (ref 26.0–34.0)
MCHC: 31.6 g/dL (ref 30.0–36.0)
MCV: 87.2 fL (ref 80.0–100.0)
Platelets: 261 10*3/uL (ref 150–400)
RBC: 3.74 MIL/uL — ABNORMAL LOW (ref 3.87–5.11)
RDW: 19.9 % — ABNORMAL HIGH (ref 11.5–15.5)
WBC: 4.3 10*3/uL (ref 4.0–10.5)
nRBC: 0 % (ref 0.0–0.2)

## 2022-06-22 LAB — TROPONIN I (HIGH SENSITIVITY): Troponin I (High Sensitivity): <2 ng/L (ref <18)

## 2022-06-22 LAB — BASIC METABOLIC PANEL
Anion gap: 8 (ref 5–15)
BUN: 14 mg/dL (ref 6–20)
CO2: 26 mmol/L (ref 22–32)
Calcium: 9.4 mg/dL (ref 8.9–10.3)
Chloride: 105 mmol/L (ref 98–111)
Creatinine, Ser: 0.84 mg/dL (ref 0.44–1.00)
GFR, Estimated: >60 mL/min (ref >60)
Glucose, Bld: 104 mg/dL — ABNORMAL HIGH (ref 70–99)
Potassium: 3.4 mmol/L — ABNORMAL LOW (ref 3.5–5.1)
Sodium: 139 mmol/L (ref 135–145)

## 2022-06-22 LAB — LIPASE, BLOOD: Lipase: 21 U/L (ref 11–51)

## 2022-06-22 NOTE — ED Triage Notes (Signed)
Back pain abdominal pain x 2 weeks  and now chest pains. Denies n/v/d, no sob Intermittent

## 2022-06-23 ENCOUNTER — Emergency Department (HOSPITAL_BASED_OUTPATIENT_CLINIC_OR_DEPARTMENT_OTHER): Payer: BLUE CROSS/BLUE SHIELD

## 2022-06-23 ENCOUNTER — Emergency Department (HOSPITAL_BASED_OUTPATIENT_CLINIC_OR_DEPARTMENT_OTHER)
Admission: EM | Admit: 2022-06-23 | Discharge: 2022-06-23 | Disposition: A | Discharge status: Home / Self Care | Payer: BLUE CROSS/BLUE SHIELD | Attending: Emergency Medicine | Admitting: Emergency Medicine

## 2022-06-23 DIAGNOSIS — K6289 Other specified diseases of anus and rectum: Secondary | ICD-10-CM

## 2022-06-23 DIAGNOSIS — R079 Chest pain, unspecified: Secondary | ICD-10-CM

## 2022-06-23 DIAGNOSIS — R1012 Left upper quadrant pain: Secondary | ICD-10-CM

## 2022-06-23 LAB — TROPONIN I (HIGH SENSITIVITY): Troponin I (High Sensitivity): 3 ng/L (ref <18)

## 2022-06-23 MED ORDER — HYDROCORTISONE ACETATE 25 MG RE SUPP: 25.0000 mg | Freq: Two times a day (BID) | RECTAL | 0 refills | Status: DC

## 2022-06-23 MED ORDER — IOHEXOL 300 MG/ML  SOLN
100.0000 mL | Freq: Once | INTRAMUSCULAR | Status: AC | PRN
  Administered 2022-06-23: 100 mL via INTRAVENOUS

## 2022-06-23 MED ORDER — DICYCLOMINE HCL 20 MG PO TABS: 20.0000 mg | ORAL_TABLET | Freq: Three times a day (TID) | ORAL | 0 refills | Status: DC

## 2022-06-23 NOTE — ED Provider Notes (Signed)
[facility] EMERGENCY DEPT Provider Note   CSN: [id number] Arrival date & time: 06/22/22  2243     History  Chief Complaint  Patient presents with   Abdominal Pain    Angel Munoz is a 46 y.o. female.  Patient presents to the emerged department for evaluation of back pain, abdominal pain, rectal pain, chest pain.  Patient reports that the back, rectal pain has been ongoing for nearly 2 weeks.  Pain is now in the left upper and left side of her abdomen as well.  Tonight she had onset of some pains in the left upper chest.       Home Medications Prior to Admission medications   Medication Sig Start Date End Date Taking? Authorizing Provider  dicyclomine (BENTYL) 20 MG tablet Take 1 tablet (20 mg total) by mouth 3 (three) times daily before meals. 06/23/22  Yes Mouhamad Teed, Gwenyth Allegra, MD  hydrocortisone (ANUSOL-HC) 25 MG suppository Place 1 suppository (25 mg total) rectally 2 (two) times daily. For 7 days 06/23/22  Yes Santiana Glidden, Gwenyth Allegra, MD  albuterol (VENTOLIN HFA) 108 (90 Base) MCG/ACT inhaler Inhale 1 puff into the lungs every 6 (six) hours as needed for wheezing or shortness of breath.  05/07/20   [provider]  benzonatate (TESSALON) 100 MG capsule Take 1 capsule (100 mg total) by mouth every 8 (eight) hours. 10/25/21   Curatolo, Adam, DO  famotidine (PEPCID) 40 MG tablet TAKE 1 TABLET BY MOUTH TWICE A DAY 01/28/21   Zehr, Janett Billow D, PA-C  methocarbamol (ROBAXIN) 500 MG tablet Take 1 tablet (500 mg total) by mouth every 8 (eight) hours as needed for muscle spasms. 01/22/21   Davonna Belling, MD  naproxen (NAPROSYN) 375 MG tablet Take 1 tablet (375 mg total) by mouth 2 (two) times daily with a meal. 11/21/20   Harris, Abigail, PA-C  omeprazole (PRILOSEC) 20 MG capsule Take 40 mg by mouth daily. 05/07/20   [provider]  ondansetron (ZOFRAN ODT) 4 MG disintegrating tablet Take 1 tablet (4 mg total) by mouth every 8 (eight) hours as  needed for nausea or vomiting. 05/19/21   Lucrezia Starch, MD      Allergies    Iron dextran    Review of Systems   Review of Systems  Physical Exam Updated Vital Signs BP 114/82 (BP Location: Right Arm)   Pulse 80   Temp 98.2 F (36.8 C) (Oral)   Resp 20   Ht '5\' 6"'$  (1.676 m)   Wt 86.2 kg   LMP 06/04/2022 (Approximate)   SpO2 100%   BMI 30.67 kg/m  Physical Exam Vitals and nursing note reviewed.  Constitutional:      General: She is not in acute distress.    Appearance: She is well-developed.  HENT:     Head: Normocephalic and atraumatic.     Mouth/Throat:     Mouth: Mucous membranes are moist.  Eyes:     General: Vision grossly intact. Gaze aligned appropriately.     Extraocular Movements: Extraocular movements intact.     Conjunctiva/sclera: Conjunctivae normal.  Cardiovascular:     Rate and Rhythm: Normal rate and regular rhythm.     Pulses: Normal pulses.     Heart sounds: Normal heart sounds, S1 normal and S2 normal. No murmur heard.    No friction rub. No gallop.  Pulmonary:     Effort: Pulmonary effort is normal. No respiratory distress.     Breath sounds: Normal breath sounds.  Abdominal:  General: Bowel sounds are normal.     Palpations: Abdomen is soft.     Tenderness: There is abdominal tenderness in the left upper quadrant and left lower quadrant. There is no guarding or rebound.     Hernia: No hernia is present.  Musculoskeletal:        General: No swelling.     Cervical back: Full passive range of motion without pain, normal range of motion and neck supple. No spinous process tenderness or muscular tenderness. Normal range of motion.     Right lower leg: No edema.     Left lower leg: No edema.  Skin:    General: Skin is warm and dry.     Capillary Refill: Capillary refill takes less than 2 seconds.     Findings: No ecchymosis, erythema, rash or wound.  Neurological:     General: No focal deficit present.     Mental Status: She is alert and  oriented to person, place, and time.     GCS: GCS eye subscore is 4. GCS verbal subscore is 5. GCS motor subscore is 6.     Cranial Nerves: Cranial nerves 2-12 are intact.     Sensory: Sensation is intact.     Motor: Motor function is intact.     Coordination: Coordination is intact.  Psychiatric:        Attention and Perception: Attention normal.        Mood and Affect: Mood normal.        Speech: Speech normal.        Behavior: Behavior normal.     ED Results / Procedures / Treatments   Labs (all labs ordered are listed, but only abnormal results are displayed) Labs Reviewed  BASIC METABOLIC PANEL - Abnormal; Notable for the following components:      Result Value   Potassium 3.4 (*)    Glucose, Bld 104 (*)    All other components within normal limits  CBC - Abnormal; Notable for the following components:   RBC 3.74 (*)    Hemoglobin 10.3 (*)    HCT 32.6 (*)    RDW 19.9 (*)    All other components within normal limits  LIPASE, BLOOD  TROPONIN I (HIGH SENSITIVITY)  TROPONIN I (HIGH SENSITIVITY)    EKG EKG Interpretation  Date/Time:  Wednesday June 22 2022 23:11:01 EST Ventricular Rate:  71 PR Interval:  160 QRS Duration: 78 QT Interval:  394 QTC Calculation: 428 R Axis:   69 Text Interpretation: Normal sinus rhythm Normal ECG When compared with ECG of 24-Oct-2021 21:12, PREVIOUS ECG IS PRESENT Confirmed by Orpah Greek [phone]) on 06/22/2022 11:33:55 PM  Radiology CT ABDOMEN PELVIS W CONTRAST  Result Date: 06/23/2022 CLINICAL DATA:  Acute abdominal pain for 2 weeks EXAM: CT ABDOMEN AND PELVIS WITH CONTRAST TECHNIQUE: Multidetector CT imaging of the abdomen and pelvis was performed using the standard protocol following bolus administration of intravenous contrast. RADIATION DOSE REDUCTION: This exam was performed according to the departmental dose-optimization program which includes automated exposure control, adjustment of the mA and/or kV according to  patient size and/or use of iterative reconstruction technique. CONTRAST:  11m OMNIPAQUE IOHEXOL 300 MG/ML  SOLN COMPARISON:  05/19/2021 FINDINGS: Lower chest: No acute abnormality. Hepatobiliary: No focal liver abnormality is seen. No gallstones, gallbladder wall thickening, or biliary dilatation. Pancreas: Unremarkable. No pancreatic ductal dilatation or surrounding inflammatory changes. Spleen: Normal in size without focal abnormality. Adrenals/Urinary Tract: Adrenal glands are within normal limits. Kidneys are  well visualized bilaterally within normal enhancement pattern. No renal calculi are noted. Small left renal cysts are noted stable from the prior exam. No follow-up is recommended. The bladder is partially distended. Stomach/Bowel: No obstructive or inflammatory changes of the colon are noted. The appendix is not well visualized. No inflammatory changes are noted to suggest appendicitis. Small bowel and stomach are within normal limits. Vascular/Lymphatic: No significant vascular findings are present. No enlarged abdominal or pelvic lymph nodes. Reproductive: Uterus demonstrates multiple calcified and noncalcified uterine fibroids similar to that seen on the prior exam. Small right adnexal cyst is noted measuring approximately 1 cm decreased in size from the prior exam. Other: No abdominal wall hernia or abnormality. No abdominopelvic ascites. Musculoskeletal: No acute or significant osseous findings. IMPRESSION: Uterine fibroid change. No acute abnormality is noted Electronically Signed   By: Inez Catalina M.D.   On: 06/23/2022 01:59   DG Chest 2 View  Result Date: 06/22/2022 CLINICAL DATA:  Chest and back pain for 2 weeks, initial encounter EXAM: CHEST - 2 VIEW COMPARISON:  10/24/2021 FINDINGS: The heart size and mediastinal contours are within normal limits. Both lungs are clear. The visualized skeletal structures are unremarkable. IMPRESSION: No active cardiopulmonary disease. Electronically  Signed   By: Inez Catalina M.D.   On: 06/22/2022 23:33    Procedures Procedures    Medications Ordered in ED Medications  iohexol (OMNIPAQUE) 300 MG/ML solution 100 mL (100 mLs Intravenous Contrast Given 06/23/22 0136)    ED Course/ Medical Decision Making/ A&P                           Medical Decision Making Amount and/or Complexity of Data Reviewed Labs: ordered. Decision-making details documented in ED Course. Radiology: ordered and independent interpretation performed. Decision-making details documented in ED Course. ECG/medicine tests: ordered and independent interpretation performed. Decision-making details documented in ED Course.  Risk Prescription drug management.   Presents to the emergency room for evaluation of multiple problems.  Patient complaining of back pain, rectal pain, abdominal pain and now has developed some chest pain.  She appears well.  Vital signs are normal.  EKG unremarkable.  Troponin negative.  Symptoms seem atypical for cardiac etiology.  Patient with back, rectal, left-sided abdominal pain.  Colonic etiology was considered.  Patient underwent CT scan to further evaluate.  No evidence of colitis, diverticulitis, proctitis.  CT scan was entirely normal.  It is unclear what is causing her pain currently, but workup has been very reassuring.  She is appropriate for further outpatient workup.  Anusol HC.  Patient does not have primary care, will refer to GI for further evaluation of her abdominal distress.        Final Clinical Impression(s) / ED Diagnoses Final diagnoses:  Chest pain, unspecified type  Left upper quadrant abdominal pain  Rectal pain    Rx / DC Orders ED Discharge Orders          Ordered    hydrocortisone (ANUSOL-HC) 25 MG suppository  2 times daily        06/23/22 0215    dicyclomine (BENTYL) 20 MG tablet  3 times daily before meals        06/23/22 0215              Orpah Greek, MD 06/23/22 [phone]

## 2022-08-18 DIAGNOSIS — M25552 Pain in left hip: Secondary | ICD-10-CM | POA: Insufficient documentation

## 2022-08-25 ENCOUNTER — Emergency Department (HOSPITAL_BASED_OUTPATIENT_CLINIC_OR_DEPARTMENT_OTHER): Payer: BLUE CROSS/BLUE SHIELD

## 2022-08-25 ENCOUNTER — Emergency Department (HOSPITAL_BASED_OUTPATIENT_CLINIC_OR_DEPARTMENT_OTHER)
Admission: EM | Admit: 2022-08-25 | Discharge: 2022-08-25 | Disposition: A | Discharge status: Home / Self Care | Payer: BLUE CROSS/BLUE SHIELD | Attending: Emergency Medicine | Admitting: Emergency Medicine

## 2022-08-25 ENCOUNTER — Encounter (HOSPITAL_BASED_OUTPATIENT_CLINIC_OR_DEPARTMENT_OTHER): Payer: Self-pay | Admitting: Urology

## 2022-08-25 DIAGNOSIS — R002 Palpitations: Secondary | ICD-10-CM | POA: Diagnosis present

## 2022-08-25 DIAGNOSIS — D649 Anemia, unspecified: Secondary | ICD-10-CM

## 2022-08-25 LAB — CBC
HCT: 32.2 % — ABNORMAL LOW (ref 36.0–46.0)
Hemoglobin: 9.7 g/dL — ABNORMAL LOW (ref 12.0–15.0)
MCH: 25.6 pg — ABNORMAL LOW (ref 26.0–34.0)
MCHC: 30.1 g/dL (ref 30.0–36.0)
MCV: 85 fL (ref 80.0–100.0)
Platelets: 201 10*3/uL (ref 150–400)
RBC: 3.79 MIL/uL — ABNORMAL LOW (ref 3.87–5.11)
RDW: 15.4 % (ref 11.5–15.5)
WBC: 3.8 10*3/uL — ABNORMAL LOW (ref 4.0–10.5)
nRBC: 0 % (ref 0.0–0.2)

## 2022-08-25 LAB — BASIC METABOLIC PANEL
Anion gap: 5 (ref 5–15)
BUN: 13 mg/dL (ref 6–20)
CO2: 23 mmol/L (ref 22–32)
Calcium: 8.3 mg/dL — ABNORMAL LOW (ref 8.9–10.3)
Chloride: 107 mmol/L (ref 98–111)
Creatinine, Ser: 0.66 mg/dL (ref 0.44–1.00)
GFR, Estimated: >60 mL/min (ref >60)
Glucose, Bld: 95 mg/dL (ref 70–99)
Potassium: 3.8 mmol/L (ref 3.5–5.1)
Sodium: 135 mmol/L (ref 135–145)

## 2022-08-25 LAB — TSH: TSH: 0.992 u[IU]/mL (ref 0.350–4.500)

## 2022-08-25 LAB — TROPONIN I (HIGH SENSITIVITY): Troponin I (High Sensitivity): 2 ng/L (ref <18)

## 2022-08-25 MED ORDER — FERROUS SULFATE 325 (65 FE) MG PO TABS: 325.0000 mg | ORAL_TABLET | Freq: Every day | ORAL | 0 refills | Status: AC

## 2022-08-25 MED ORDER — LACTATED RINGERS IV BOLUS
1000.0000 mL | Freq: Once | INTRAVENOUS | Status: AC
  Administered 2022-08-25: 1000 mL via INTRAVENOUS

## 2022-08-25 NOTE — ED Triage Notes (Signed)
Pt states feeling heart palpitations and lightheadedness that started approx 1.5 hr PTA  BP at home 140/85

## 2022-08-25 NOTE — ED Provider Notes (Signed)
[facility] Provider Note   CSN: [id number] Arrival date & time: 08/25/22  1255     History  Chief Complaint  Patient presents with   Palpitations    Angel Munoz is a 47 y.o. female.  With PMH of iron deficiency anemia who presents after episode of palpitations.  Patient was in car on the way home from [place] when she began to feel palpitations that lasted approximately an hour and a half with some lightheadedness feeling like she was having a head rushing sensation.  She had no associated syncope.  No associated chest pain, shortness of breath, vomiting, diaphoresis.  She has never had this before.  She notes being on her menstrual period and having history of heavy menstrual periods that started yesterday.  She has had no fevers or illness.  No history of PE or DVT.  No leg pain or swelling.  She is not on any blood thinners or oral contraceptives.   Palpitations      Home Medications Prior to Admission medications   Medication Sig Start Date End Date Taking? Authorizing Provider  ferrous sulfate 325 (65 FE) MG tablet Take 1 tablet (325 mg total) by mouth daily. 08/25/22  Yes Elgie Congo, MD  albuterol (VENTOLIN HFA) 108 (90 Base) MCG/ACT inhaler Inhale 1 puff into the lungs every 6 (six) hours as needed for wheezing or shortness of breath.  05/07/20   [provider]  benzonatate (TESSALON) 100 MG capsule Take 1 capsule (100 mg total) by mouth every 8 (eight) hours. 10/25/21   Curatolo, Adam, DO  dicyclomine (BENTYL) 20 MG tablet Take 1 tablet (20 mg total) by mouth 3 (three) times daily before meals. 06/23/22   Orpah Greek, MD  famotidine (PEPCID) 40 MG tablet TAKE 1 TABLET BY MOUTH TWICE A DAY 01/28/21   Zehr, Laban Emperor, PA-C  hydrocortisone (ANUSOL-HC) 25 MG suppository Place 1 suppository (25 mg total) rectally 2 (two) times daily. For 7 days 06/23/22   Orpah Greek, MD   methocarbamol (ROBAXIN) 500 MG tablet Take 1 tablet (500 mg total) by mouth every 8 (eight) hours as needed for muscle spasms. 01/22/21   Davonna Belling, MD  naproxen (NAPROSYN) 375 MG tablet Take 1 tablet (375 mg total) by mouth 2 (two) times daily with a meal. 11/21/20   Harris, Abigail, PA-C  omeprazole (PRILOSEC) 20 MG capsule Take 40 mg by mouth daily. 05/07/20   [provider]  ondansetron (ZOFRAN ODT) 4 MG disintegrating tablet Take 1 tablet (4 mg total) by mouth every 8 (eight) hours as needed for nausea or vomiting. 05/19/21   Lucrezia Starch, MD      Allergies    Iron dextran    Review of Systems   Review of Systems  Cardiovascular:  Positive for palpitations.    Physical Exam Updated Vital Signs BP 128/70 (BP Location: Right Arm)   Pulse 66   Temp 98.7 F (37.1 C) (Oral)   Resp 18   Ht 5' 6"$  (1.676 m)   Wt 86.2 kg   LMP 08/25/2022 (Exact Date)   SpO2 100%   BMI 30.67 kg/m  Physical Exam Constitutional: Alert and oriented. Well appearing and in no distress. Eyes: Conjunctivae are normal. ENT      Head: Normocephalic and atraumatic.      Nose: No congestion.      Mouth/Throat: Mucous membranes are moist.      Neck: No stridor. Cardiovascular: S1,  S2,  Normal and symmetric distal pulses are present in all extremities.Warm and well perfused. Respiratory: Normal respiratory effort. Breath sounds are normal. O2 sat 100 on RA Gastrointestinal: nondistended Musculoskeletal: Normal range of motion in all extremities.      Right lower leg: No tenderness or edema.      Left lower leg: No tenderness or edema. Neurologic: Normal speech and language. No gross focal neurologic deficits are appreciated. Skin: Skin is warm, dry and intact. No rash noted. Psychiatric: Mood and affect are normal. Speech and behavior are normal.  ED Results / Procedures / Treatments   Labs (all labs ordered are listed, but only abnormal results are displayed) Labs Reviewed   BASIC METABOLIC PANEL - Abnormal; Notable for the following components:      Result Value   Calcium 8.3 (*)    All other components within normal limits  CBC - Abnormal; Notable for the following components:   WBC 3.8 (*)    RBC 3.79 (*)    Hemoglobin 9.7 (*)    HCT 32.2 (*)    MCH 25.6 (*)    All other components within normal limits  PREGNANCY, URINE  TSH  TROPONIN I (HIGH SENSITIVITY)  TROPONIN I (HIGH SENSITIVITY)    EKG EKG Interpretation  Date/Time:  Thursday August 25 2022 13:07:11 EST Ventricular Rate:  64 PR Interval:  171 QRS Duration: 96 QT Interval:  423 QTC Calculation: 437 R Axis:   61 Text Interpretation: Sinus rhythm Confirmed by Georgina Snell [phone]) on 08/25/2022 1:42:09 PM  Radiology DG Chest 2 View  Result Date: 08/25/2022 CLINICAL DATA:  Palpitations. EXAM: CHEST - 2 VIEW COMPARISON:  06/24/2022. FINDINGS: Clear lungs. Normal heart size and mediastinal contours. No pleural effusion or pneumothorax. Visualized bones and upper abdomen are unremarkable. IMPRESSION: No evidence of acute cardiopulmonary disease. Electronically Signed   By: Emmit Alexanders M.D.   On: 08/25/2022 13:26    Procedures Procedures  Remain on constant cardiac monitoring sinus rhythm with normal rates.  Medications Ordered in ED Medications  lactated ringers bolus 1,000 mL ( Intravenous Stopped 08/25/22 1505)    ED Course/ Medical Decision Making/ A&P   {    Medical Decision Making  Angel Munoz is a 47 y.o. female.  With PMH of iron deficiency anemia who presents after episode of palpitations.   EKG obtained here sinus rhythm without acute ischemic changes, normal QTc, no ARVD, no WPW.  Differential for patient's presentation includes but not limited to thyroid abnormalities, acute electrolyte abnormalities, anemia, paroxysmal arrhythmia among multiple other etiologies.  I do not think PE as patient is PERC negative.  Labs reviewed by me, normal  creatinine 0.66.  No acute electrolyte abnormalities.  Glucose 95.  Troponin 2 and unremarkable, no concern for ACS.  Mild anemia hemoglobin 9.7 with normal MCV, not requiring transfusion at this time.  Chest x-ray reviewed by me no pneumonia.  No pneumothorax or pulmonary edema.  Patient symptoms subsided upon arrival to ED.  Given IV fluids.  Suspect some component of palpitations from anemia from blood loss however not requiring acute transfusion.  Discussed close follow-up with PCP and made referral to outpatient follow-up with cardiology for Holter monitor.  Provided prescription for iron pills.  Return precaution discussed.  Safe for discharge at this time.  Amount and/or Complexity of Data Reviewed Labs: ordered. Radiology: ordered.  Risk OTC drugs.    Final Clinical Impression(s) / ED Diagnoses Final diagnoses:  Palpitations  Anemia, unspecified type  Rx / DC Orders ED Discharge Orders          Ordered    Ambulatory referral to Cardiology       Comments: If you have not heard from the Cardiology office within the next 72 hours please call [phone].   08/25/22 1510    ferrous sulfate 325 (65 FE) MG tablet  Daily        08/25/22 1510              Elgie Congo, MD 08/25/22 1820

## 2022-08-25 NOTE — Discharge Instructions (Addendum)
You were seen in the emergency department today for palpitations. Your workup, including labs, chest X-ray, an EKG and ongoing heart monitoring, did not reveal a definite cause of your symptoms but was generally reassuring.   As as we discussed, you are slightly anemic but do not require blood transfusion.  I have discharged you with a prescription for iron pills.  Return to the emergency department immediately if you experience fainting spells, chest pain, difficulty breathing, or any other concerning symptoms.  Please make an appointment to follow up with your primary care doctor or cardiologist within one week to assure improvement or resolution in symptoms, and also for consideration of additional testing.   If you have not heard from the Cardiology office within the next 72 hours please call [phone].

## 2022-08-25 NOTE — ED Notes (Signed)
Patient transported to X-ray 

## 2022-11-03 ENCOUNTER — Ambulatory Visit: Admit: 2022-11-03 | Discharge status: Absent without leave | Payer: BLUE CROSS/BLUE SHIELD

## 2022-12-25 ENCOUNTER — Encounter (HOSPITAL_BASED_OUTPATIENT_CLINIC_OR_DEPARTMENT_OTHER): Payer: Self-pay | Admitting: Emergency Medicine

## 2022-12-25 ENCOUNTER — Emergency Department (HOSPITAL_BASED_OUTPATIENT_CLINIC_OR_DEPARTMENT_OTHER): Payer: BLUE CROSS/BLUE SHIELD

## 2022-12-25 ENCOUNTER — Emergency Department (HOSPITAL_BASED_OUTPATIENT_CLINIC_OR_DEPARTMENT_OTHER)
Admission: EM | Admit: 2022-12-25 | Discharge: 2022-12-25 | Disposition: A | Discharge status: Home / Self Care | Payer: BLUE CROSS/BLUE SHIELD | Attending: Emergency Medicine | Admitting: Emergency Medicine

## 2022-12-25 ENCOUNTER — Other Ambulatory Visit: Payer: Self-pay

## 2022-12-25 DIAGNOSIS — N939 Abnormal uterine and vaginal bleeding, unspecified: Secondary | ICD-10-CM

## 2022-12-25 DIAGNOSIS — D649 Anemia, unspecified: Secondary | ICD-10-CM

## 2022-12-25 DIAGNOSIS — D259 Leiomyoma of uterus, unspecified: Secondary | ICD-10-CM | POA: Diagnosis not present

## 2022-12-25 LAB — CBC WITH DIFFERENTIAL/PLATELET
Abs Immature Granulocytes: 0.01 10*3/uL (ref 0.00–0.07)
Basophils Absolute: 0.1 10*3/uL (ref 0.0–0.1)
Basophils Relative: 1 %
Eosinophils Absolute: 0.1 10*3/uL (ref 0.0–0.5)
Eosinophils Relative: 3 %
HCT: 29.6 % — ABNORMAL LOW (ref 36.0–46.0)
Hemoglobin: 8.7 g/dL — ABNORMAL LOW (ref 12.0–15.0)
Immature Granulocytes: 0 %
Lymphocytes Relative: 37 %
Lymphs Abs: 1.9 10*3/uL (ref 0.7–4.0)
MCH: 23.4 pg — ABNORMAL LOW (ref 26.0–34.0)
MCHC: 29.4 g/dL — ABNORMAL LOW (ref 30.0–36.0)
MCV: 79.6 fL — ABNORMAL LOW (ref 80.0–100.0)
Monocytes Absolute: 0.5 10*3/uL (ref 0.1–1.0)
Monocytes Relative: 9 %
Neutro Abs: 2.6 10*3/uL (ref 1.7–7.7)
Neutrophils Relative %: 50 %
Platelets: 317 10*3/uL (ref 150–400)
RBC: 3.72 MIL/uL — ABNORMAL LOW (ref 3.87–5.11)
RDW: 19.7 % — ABNORMAL HIGH (ref 11.5–15.5)
WBC: 5.1 10*3/uL (ref 4.0–10.5)
nRBC: 0 % (ref 0.0–0.2)

## 2022-12-25 LAB — COMPREHENSIVE METABOLIC PANEL
ALT: 11 U/L (ref 0–44)
AST: 15 U/L (ref 15–41)
Albumin: 3.4 g/dL — ABNORMAL LOW (ref 3.5–5.0)
Alkaline Phosphatase: 72 U/L (ref 38–126)
Anion gap: 8 (ref 5–15)
BUN: 18 mg/dL (ref 6–20)
CO2: 23 mmol/L (ref 22–32)
Calcium: 8.4 mg/dL — ABNORMAL LOW (ref 8.9–10.3)
Chloride: 108 mmol/L (ref 98–111)
Creatinine, Ser: 0.83 mg/dL (ref 0.44–1.00)
GFR, Estimated: >60 mL/min (ref >60)
Glucose, Bld: 102 mg/dL — ABNORMAL HIGH (ref 70–99)
Potassium: 3.5 mmol/L (ref 3.5–5.1)
Sodium: 139 mmol/L (ref 135–145)
Total Bilirubin: 0.3 mg/dL (ref 0.3–1.2)
Total Protein: 6.8 g/dL (ref 6.5–8.1)

## 2022-12-25 LAB — URINALYSIS, ROUTINE W REFLEX MICROSCOPIC
Bilirubin Urine: NEGATIVE
Glucose, UA: NEGATIVE mg/dL
Ketones, ur: NEGATIVE mg/dL
Leukocytes,Ua: NEGATIVE
Nitrite: NEGATIVE
Protein, ur: NEGATIVE mg/dL
Specific Gravity, Urine: 1.015 (ref 1.005–1.030)
pH: 6.5 (ref 5.0–8.0)

## 2022-12-25 LAB — PROTIME-INR
INR: 1 (ref 0.8–1.2)
Prothrombin Time: 13.4 seconds (ref 11.4–15.2)

## 2022-12-25 LAB — URINALYSIS, MICROSCOPIC (REFLEX)

## 2022-12-25 LAB — LIPASE, BLOOD: Lipase: 43 U/L (ref 11–51)

## 2022-12-25 LAB — HCG, QUANTITATIVE, PREGNANCY: hCG, Beta Chain, Quant, S: <1 m[IU]/mL (ref <5)

## 2022-12-25 LAB — HEMOGLOBIN AND HEMATOCRIT, BLOOD
HCT: 26.4 % — ABNORMAL LOW (ref 36.0–46.0)
Hemoglobin: 7.7 g/dL — ABNORMAL LOW (ref 12.0–15.0)

## 2022-12-25 MED ORDER — KETOROLAC TROMETHAMINE 15 MG/ML IJ SOLN
15.0000 mg | Freq: Once | INTRAMUSCULAR | Status: AC
  Administered 2022-12-25: 15 mg via INTRAVENOUS
  Filled 2022-12-25: qty 1

## 2022-12-25 MED ORDER — TRANEXAMIC ACID 650 MG PO TABS: 1300.0000 mg | ORAL_TABLET | Freq: Three times a day (TID) | ORAL | 0 refills | Status: AC

## 2022-12-25 MED ORDER — ONDANSETRON HCL 4 MG/2ML IJ SOLN
4.0000 mg | Freq: Once | INTRAMUSCULAR | Status: AC
  Administered 2022-12-25: 4 mg via INTRAVENOUS
  Filled 2022-12-25: qty 2

## 2022-12-25 MED ORDER — MORPHINE SULFATE (PF) 4 MG/ML IV SOLN
4.0000 mg | Freq: Once | INTRAVENOUS | Status: AC
  Administered 2022-12-25: 4 mg via INTRAVENOUS
  Filled 2022-12-25: qty 1

## 2022-12-25 MED ORDER — IOHEXOL 300 MG/ML  SOLN
100.0000 mL | Freq: Once | INTRAMUSCULAR | Status: AC | PRN
  Administered 2022-12-25: 100 mL via INTRAVENOUS

## 2022-12-25 MED ORDER — OXYCODONE HCL 5 MG PO TABS: 5.0000 mg | ORAL_TABLET | Freq: Four times a day (QID) | ORAL | 0 refills | Status: DC | PRN

## 2022-12-25 MED ORDER — ONDANSETRON 4 MG PO TBDP: 4.0000 mg | ORAL_TABLET | Freq: Three times a day (TID) | ORAL | 0 refills | Status: DC | PRN

## 2022-12-25 MED ORDER — LACTATED RINGERS IV BOLUS
1000.0000 mL | Freq: Once | INTRAVENOUS | Status: AC
  Administered 2022-12-25: 1000 mL via INTRAVENOUS

## 2022-12-25 MED ORDER — TRANEXAMIC ACID-NACL 1000-0.7 MG/100ML-% IV SOLN
1000.0000 mg | INTRAVENOUS | Status: AC
  Administered 2022-12-25: 1000 mg via INTRAVENOUS
  Filled 2022-12-25: qty 100

## 2022-12-25 NOTE — ED Triage Notes (Signed)
Pt reports vaginal bleeding with clots, and lower abd pain since yesterday. She states she believes it was time for her menstrual cycle but that it is not normally this heavy, or painful. Endorses nausea. Denies SHOB, lightheadedness.

## 2022-12-25 NOTE — Discharge Instructions (Addendum)
You were seen for abdominal pain and heavy vaginal bleeding likely related to your enlarged fibroid uterus.  Keep taking scheduled Aleve throughout the day as well as Tylenol as needed for pain control.  Take the TXA medication as prescribed to help reduce amount of bleeding.  Continue taking your iron supplementation daily.  If pain is still severe, you can take oxycodone as needed but do not take while driving a vehicle or drinking alcohol.  You can take Zofran as needed for nausea or vomiting.  Make an appointment as soon as possible to follow-up with gynecology listed in your discharge paperwork.  Come back for any severe worsening uncontrolled pain, heavy bleeding, bleeding through a pad every hour for 3 or more hours, fainting episodes, signs and symptoms of severe anemia such as chest pain, shortness of breath, fatigue, or any other symptoms concerning to you.

## 2022-12-25 NOTE — ED Provider Notes (Signed)
[facility] Provider Note   CSN: [id number] Arrival date & time: 12/25/22  0135     History  Chief Complaint  Patient presents with   Vaginal Bleeding    Angel Munoz is a 47 y.o. female.  With PMH of anxiety, IDA, fibroids who presents with lower abdominal pain and heavy vaginal bleeding.  Patient says she started with light spotting and bleeding yesterday progressed to heavy bleeding over the last hour.  She has changed her pad over the past 2 hours 3 times.  She is passing half dollar size clots.  She is having no vomiting, no chest pain, no shortness of breath, no fainting episodes.  She is complaining of severe cramping in her lower pelvic region.  She was previously on hormonal medications for heavy menstrual bleeding and fibroids but says it did not work for her and stopped taking them.  She is not on any anticoagulation.  No history of PE, DVT or clotting disorder.  She is not a smoker.  She took Aleve prior to coming into the ER.  Denies any pain with urination or burning.  HPI     Home Medications Prior to Admission medications   Medication Sig Start Date End Date Taking? Authorizing Provider  ondansetron (ZOFRAN-ODT) 4 MG disintegrating tablet Take 1 tablet (4 mg total) by mouth every 8 (eight) hours as needed. 12/25/22  Yes Mardene Sayer, MD  oxyCODONE (ROXICODONE) 5 MG immediate release tablet Take 1 tablet (5 mg total) by mouth every 6 (six) hours as needed for up to 10 doses for severe pain. 12/25/22  Yes Mardene Sayer, MD  tranexamic acid (LYSTEDA) 650 MG TABS tablet Take 2 tablets (1,300 mg total) by mouth 3 (three) times daily for 5 days. 12/25/22 12/30/22 Yes Mardene Sayer, MD  albuterol (VENTOLIN HFA) 108 (90 Base) MCG/ACT inhaler Inhale 1 puff into the lungs every 6 (six) hours as needed for wheezing or shortness of breath.  05/07/20   [provider]  benzonatate (TESSALON) 100 MG capsule  Take 1 capsule (100 mg total) by mouth every 8 (eight) hours. 10/25/21   Curatolo, Adam, DO  dicyclomine (BENTYL) 20 MG tablet Take 1 tablet (20 mg total) by mouth 3 (three) times daily before meals. 06/23/22   Gilda Crease, MD  famotidine (PEPCID) 40 MG tablet TAKE 1 TABLET BY MOUTH TWICE A DAY 01/28/21   Zehr, Shanda Bumps D, PA-C  ferrous sulfate 325 (65 FE) MG tablet Take 1 tablet (325 mg total) by mouth daily. 08/25/22   Mardene Sayer, MD  hydrocortisone (ANUSOL-HC) 25 MG suppository Place 1 suppository (25 mg total) rectally 2 (two) times daily. For 7 days 06/23/22   Gilda Crease, MD  methocarbamol (ROBAXIN) 500 MG tablet Take 1 tablet (500 mg total) by mouth every 8 (eight) hours as needed for muscle spasms. 01/22/21   Benjiman Core, MD  naproxen (NAPROSYN) 375 MG tablet Take 1 tablet (375 mg total) by mouth 2 (two) times daily with a meal. 11/21/20   Harris, Abigail, PA-C  omeprazole (PRILOSEC) 20 MG capsule Take 40 mg by mouth daily. 05/07/20   [provider]  ondansetron (ZOFRAN ODT) 4 MG disintegrating tablet Take 1 tablet (4 mg total) by mouth every 8 (eight) hours as needed for nausea or vomiting. 05/19/21   Milagros Loll, MD      Allergies    Iron dextran    Review of Systems  Review of Systems  Physical Exam Updated Vital Signs BP (!) 115/59 (BP Location: Right Arm)   Pulse 63   Temp (!) 97.5 F (36.4 C) (Oral)   Resp 18   Ht 5\' 6"  (1.676 m)   Wt 90.7 kg   LMP 12/24/2022   SpO2 96%   BMI 32.28 kg/m  Physical Exam Constitutional: Alert and oriented.  Slightly uncomfortable but nontoxic Eyes: Conjunctivae are normal. ENT      Head: Normocephalic and atraumatic. Cardiovascular: S1, S2, regular rate Respiratory: Normal respiratory effort.  Gastrointestinal: Soft and nondistended with lower suprapubic midline pelvic tenderness.  No rebound or guarding.  No CVA tenderness. Musculoskeletal: Normal range of motion in all  extremities. Neurologic: Normal speech and language. No gross focal neurologic deficits are appreciated. Skin: Skin is warm, dry and intact. No rash noted. Psychiatric: Mood and affect are normal. Speech and behavior are normal.  ED Results / Procedures / Treatments   Labs (all labs ordered are listed, but only abnormal results are displayed) Labs Reviewed  COMPREHENSIVE METABOLIC PANEL - Abnormal; Notable for the following components:      Result Value   Glucose, Bld 102 (*)    Calcium 8.4 (*)    Albumin 3.4 (*)    All other components within normal limits  CBC WITH DIFFERENTIAL/PLATELET - Abnormal; Notable for the following components:   RBC 3.72 (*)    Hemoglobin 8.7 (*)    HCT 29.6 (*)    MCV 79.6 (*)    MCH 23.4 (*)    MCHC 29.4 (*)    RDW 19.7 (*)    All other components within normal limits  URINALYSIS, ROUTINE W REFLEX MICROSCOPIC - Abnormal; Notable for the following components:   APPearance HAZY (*)    Hgb urine dipstick MODERATE (*)    All other components within normal limits  HEMOGLOBIN AND HEMATOCRIT, BLOOD - Abnormal; Notable for the following components:   Hemoglobin 7.7 (*)    HCT 26.4 (*)    All other components within normal limits  URINALYSIS, MICROSCOPIC (REFLEX) - Abnormal; Notable for the following components:   Bacteria, UA RARE (*)    All other components within normal limits  LIPASE, BLOOD  HCG, QUANTITATIVE, PREGNANCY  PROTIME-INR    EKG None  Radiology CT ABDOMEN PELVIS W CONTRAST  Result Date: 12/25/2022 CLINICAL DATA:  47 year old female history of severe lower abdominal pain. Vaginal bleeding passing clots. EXAM: CT ABDOMEN AND PELVIS WITH CONTRAST TECHNIQUE: Multidetector CT imaging of the abdomen and pelvis was performed using the standard protocol following bolus administration of intravenous contrast. RADIATION DOSE REDUCTION: This exam was performed according to the departmental dose-optimization program which includes automated  exposure control, adjustment of the mA and/or kV according to patient size and/or use of iterative reconstruction technique. CONTRAST:  OMNIPAQUE IOHEXOL 300 MG/ML  SOLN COMPARISON:  CT of the abdomen and pelvis 06/23/2022. FINDINGS: Lower chest: Unremarkable. Hepatobiliary: No suspicious cystic or solid hepatic lesions. No intra or extrahepatic biliary ductal dilatation. Gallbladder is unremarkable in appearance. Pancreas: No pancreatic mass. No pancreatic ductal dilatation. No pancreatic or peripancreatic fluid collections or inflammatory changes. Spleen: Unremarkable. Adrenals/Urinary Tract: Low-attenuation lesions in the left kidney measuring up to 1.2 cm in the interpolar region, compatible with simple cysts (Bosniak class 1, no imaging follow-up recommended), similar to the prior study. Right kidney and bilateral adrenal glands are otherwise normal in appearance. No hydroureteronephrosis. Urinary bladder is unremarkable in appearance. Stomach/Bowel: The appearance of the stomach is  normal. There is no pathologic dilatation of small bowel or colon. Normal appendix. Vascular/Lymphatic: No significant atherosclerotic disease, aneurysm or dissection noted in the abdominal or pelvic vasculature. No lymphadenopathy noted in the abdomen or pelvis. Reproductive: Uterus is enlarged and heterogeneous in appearance with multiple lesions. Most notably, in the fundus of the uterus (axial image 73 of series 2 and sagittal image 63 of series 6) there is a 5.6 x 4.6 x 4.6 cm enhancing lesion in a submucosal position which extends into the fundal portion of the endometrial canal, surrounded by a large volume of endometrial fluid which extends through the lower uterine segment and cervical os (likely some blood products, although underlying cervical or endocervical lesion is not excluded). Along the posterior aspect of this lesion (axial image 73 of series 2) there is a small focal contour abnormality measuring  approximately 1.7 x 0.9 cm which was not readily apparent on prior examinations. There is an additional dominant partially calcified exophytic lesion extending off the right side of the uterine fundus which measures up to 6.5 x 4.2 x 4.4 cm. Ovaries are unremarkable in appearance. Other: No significant volume of ascites.  No pneumoperitoneum. Musculoskeletal: There are no aggressive appearing lytic or blastic lesions noted in the visualized portions of the skeleton. IMPRESSION: 1. Enlarged heterogeneous appearing uterus once again noted, similar to prior studies, likely multifocal fibroids, including a dominant submucosal fibroid in the fundus. Today's study demonstrates a large amount of endometrial fluid, much of which extends through the cervix, likely to reflect active bleeding. The possibility of malignancy in this dominant submucosal fibroid, within the endometrium, or in the cervix is not excluded. Consultation with OB-GYN for further clinical evaluation and tissue sampling is advised. 2. No other acute findings noted elsewhere in the abdomen or pelvis. Electronically Signed   By: Trudie Reed M.D.   On: 12/25/2022 05:21    Procedures .Critical Care  Performed by: Mardene Sayer, MD Authorized by: Mardene Sayer, MD   Critical care provider statement:    Critical care time (minutes):  40   Critical care was necessary to treat or prevent imminent or life-threatening deterioration of the following conditions:  Circulatory failure   Critical care was time spent personally by me on the following activities:  Development of treatment plan with patient or surrogate, evaluation of patient's response to treatment, examination of patient, ordering and review of laboratory studies, ordering and review of radiographic studies, ordering and performing treatments and interventions, pulse oximetry, re-evaluation of patient's condition, review of old charts, discussions with consultants and obtaining  history from patient or surrogate     Medications Ordered in ED Medications  morphine (PF) 4 MG/ML injection 4 mg (4 mg Intravenous Given 12/25/22 0221)  ondansetron (ZOFRAN) injection 4 mg (4 mg Intravenous Given 12/25/22 0213)  ketorolac (TORADOL) 15 MG/ML injection 15 mg (15 mg Intravenous Given 12/25/22 0219)  lactated ringers bolus 1,000 mL (0 mLs Intravenous Stopped 12/25/22 0600)  tranexamic acid (CYKLOKAPRON) IVPB 1,000 mg (0 mg Intravenous Stopped 12/25/22 0604)  iohexol (OMNIPAQUE) 300 MG/ML solution 100 mL (100 mLs Intravenous Contrast Given 12/25/22 0423)    ED Course/ Medical Decision Making/ A&P Clinical Course as of 12/25/22 0616  Sun Dec 25, 2022  0548 Reassessed patient, despite drop in hemoglobin which is likely also contributed from hemodilution, she endorses decreased bleeding and light bleeding currently.  She is hemodynamically stable and I have offered blood transfusion which she politely declines and she is not having severe  bleeding at this time.  She will continue TXA as prescribed and scheduled Aleve and as needed oxycodone for pain control and follow-up with gynecology.  She is already on oral iron supplementation.  Bleeding and return precautions discussed. [VB]    Clinical Course User Index [VB] Mardene Sayer, MD                             Medical Decision Making Tomiye Merten Shorr is a 47 y.o. female.  With PMH of anxiety, IDA, fibroids who presents with lower abdominal pain and heavy vaginal bleeding.   Patient has known history of fibroids, suspect her symptoms are likely related to fibroid and menstrual cramps.  Also consider other abnormalities such as coagulopathy, adenomyosis, polyps among multiple other possibilities.  Patient is hCG negative.,  Not concern for ectopic pregnancy.  Initial hemoglobin microcytic anemia hemoglobin 8.7 down from 9.7 approximately 4 months prior.  She has normal platelet count 317.  Normal INR, not concern for  coagulopathy.  CTAP with contrast obtained which I personally reviewed showing large heterogeneous fibroid uterus with evidence of active bleeding.    After IV fluid bolus which certainly contributed to hemodilution, repeat H&H hemoglobin 7.7.  Patient is hemodynamically stable.  She also received dose of IV TXA in ED.  Spoke with Dr. Sallye Ober on-call for gynecology who is in agreement with IV TXA and if worsening could give IV estrogen however patient does not like taking anti hormonal and has had previous poor side effects.  I offered patient blood transfusion by transferring over to [facility] or [facility] however patient would not like to do blood transfusion at this time as since being in the ER she has not had any severe or significant bleeding and endorses improvement.She will continue TXA as prescribed and scheduled Aleve and as needed oxycodone for pain control and follow-up with gynecology.  She is already on oral iron supplementation.  Bleeding and return precautions discussed.    Amount and/or Complexity of Data Reviewed Labs: ordered. Radiology: ordered.  Risk Prescription drug management.     Final Clinical Impression(s) / ED Diagnoses Final diagnoses:  Vaginal bleeding  Anemia, unspecified type  Uterine leiomyoma, unspecified location    Rx / DC Orders ED Discharge Orders          Ordered    oxyCODONE (ROXICODONE) 5 MG immediate release tablet  Every 6 hours PRN        12/25/22 0548    ondansetron (ZOFRAN-ODT) 4 MG disintegrating tablet  Every 8 hours PRN        12/25/22 0548    tranexamic acid (LYSTEDA) 650 MG TABS tablet  3 times daily        12/25/22 0548              Mardene Sayer, MD 12/25/22 [phone]

## 2022-12-30 ENCOUNTER — Non-Acute Institutional Stay (HOSPITAL_COMMUNITY): Admission: RE | Admit: 2022-12-30 | Payer: BLUE CROSS/BLUE SHIELD | Source: Ambulatory Visit

## 2022-12-30 ENCOUNTER — Encounter (HOSPITAL_COMMUNITY): Payer: Self-pay

## 2023-01-06 ENCOUNTER — Telehealth: Payer: Self-pay

## 2023-01-06 NOTE — Telephone Encounter (Signed)
Pt called to advise infusion order has been faxed over and she needs an appointment. KH

## 2023-01-09 ENCOUNTER — Non-Acute Institutional Stay (HOSPITAL_COMMUNITY)
Admission: RE | Admit: 2023-01-09 | Discharge: 2023-01-09 | Disposition: A | Discharge status: Home / Self Care | Payer: BLUE CROSS/BLUE SHIELD | Source: Ambulatory Visit | Attending: Internal Medicine | Admitting: Internal Medicine

## 2023-01-09 DIAGNOSIS — D509 Iron deficiency anemia, unspecified: Secondary | ICD-10-CM | POA: Insufficient documentation

## 2023-01-09 MED ORDER — ACETAMINOPHEN 500 MG PO TABS
1000.0000 mg | ORAL_TABLET | Freq: Once | ORAL | Status: AC
  Administered 2023-01-09: 1000 mg via ORAL
  Filled 2023-01-09: qty 2

## 2023-01-09 MED ORDER — IRON SUCROSE 500 MG IVPB - SIMPLE MED
500.0000 mg | Freq: Once | INTRAVENOUS | Status: AC
  Administered 2023-01-09: 500 mg via INTRAVENOUS
  Filled 2023-01-09: qty 500

## 2023-01-09 MED ORDER — SODIUM CHLORIDE 0.9 % IV SOLN: INTRAVENOUS | Status: DC | PRN

## 2023-01-09 NOTE — Progress Notes (Signed)
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 Patient pre-mediated with 1000 mg Tylenol per verbal order from [facility] Medicine staff member, Alaa. Patient tolerated infusion well with no adverse reaction. Vital signs stable. Discharge instructions given. Verified with [facility] Medicine staff, Alaa, that patient is to receives Venofer infusion every 14 days x 2 doses. Patient scheduled to come back in 2 weeks for second infusion. Patient alert, oriented and ambulatory at discharge. Discharged home with husband.

## 2023-01-10 ENCOUNTER — Telehealth: Payer: Self-pay | Admitting: Hematology and Oncology

## 2023-01-17 ENCOUNTER — Telehealth: Payer: Self-pay

## 2023-01-17 ENCOUNTER — Inpatient Hospital Stay: Payer: BLUE CROSS/BLUE SHIELD

## 2023-01-17 ENCOUNTER — Telehealth: Payer: Self-pay | Admitting: Hematology and Oncology

## 2023-01-17 ENCOUNTER — Inpatient Hospital Stay: Payer: BLUE CROSS/BLUE SHIELD | Admitting: Hematology and Oncology

## 2023-01-17 NOTE — Progress Notes (Signed)
[facility] [phone]   [phone]  INITIAL CONSULT NOTE  Patient Care Team: Patient, No Pcp Per as PCP - General (General Practice)  Hematological/Oncological History # Iron Deficiency 2/2 to GYN Bleeding   CHIEF COMPLAINTS/PURPOSE OF CONSULTATION:  " Iron Deficiency 2/2 to GYN Bleeding  "  HISTORY OF PRESENTING ILLNESS:  Angel Munoz 47 y.o. female with medical history significant for ***  On review of the previous records ***  On exam today ***  MEDICAL HISTORY:  Past Medical History:  Diagnosis Date   Anemia    Anxiety    Blood transfusion without reported diagnosis    Fibroid    Pica in adults     SURGICAL HISTORY: No past surgical history on file.  SOCIAL HISTORY: Social History   Socioeconomic History   Marital status: Married    Spouse name: Not on file   Number of children: Not on file   Years of education: Not on file   Highest education level: Not on file  Occupational History   Not on file  Tobacco Use   Smoking status: Never   Smokeless tobacco: Never  Substance and Sexual Activity   Alcohol use: Not Currently   Drug use: Not Currently   Sexual activity: Not Currently  Other Topics Concern   Not on file  Social History Narrative   Not on file   Social Determinants of Health   Financial Resource Strain: Not on file  Food Insecurity: Not on file  Transportation Needs: Not on file  Physical Activity: Not on file  Stress: Not on file  Social Connections: Not on file  Intimate Partner Violence: Not on file    FAMILY HISTORY: No family history on file.  ALLERGIES:  is allergic to iron dextran.  MEDICATIONS:  Current Outpatient Medications  Medication Sig Dispense Refill   albuterol (VENTOLIN HFA) 108 (90 Base) MCG/ACT inhaler Inhale 1 puff into the lungs every 6 (six) hours as needed for wheezing or shortness of breath.      benzonatate (TESSALON) 100 MG capsule Take 1 capsule (100 mg  total) by mouth every 8 (eight) hours. 21 capsule 0   dicyclomine (BENTYL) 20 MG tablet Take 1 tablet (20 mg total) by mouth 3 (three) times daily before meals. 90 tablet 0   famotidine (PEPCID) 40 MG tablet TAKE 1 TABLET BY MOUTH TWICE A DAY 180 tablet 1   ferrous sulfate 325 (65 FE) MG tablet Take 1 tablet (325 mg total) by mouth daily. 30 tablet 0   hydrocortisone (ANUSOL-HC) 25 MG suppository Place 1 suppository (25 mg total) rectally 2 (two) times daily. For 7 days 14 suppository 0   methocarbamol (ROBAXIN) 500 MG tablet Take 1 tablet (500 mg total) by mouth every 8 (eight) hours as needed for muscle spasms. 8 tablet 0   naproxen (NAPROSYN) 375 MG tablet Take 1 tablet (375 mg total) by mouth 2 (two) times daily with a meal. 20 tablet 0   omeprazole (PRILOSEC) 20 MG capsule Take 40 mg by mouth daily.     ondansetron (ZOFRAN ODT) 4 MG disintegrating tablet Take 1 tablet (4 mg total) by mouth every 8 (eight) hours as needed for nausea or vomiting. 20 tablet 0   ondansetron (ZOFRAN-ODT) 4 MG disintegrating tablet Take 1 tablet (4 mg total) by mouth every 8 (eight) hours as needed. 20 tablet 0   oxyCODONE (ROXICODONE) 5 MG immediate release tablet Take 1 tablet (5 mg total) by mouth every  6 (six) hours as needed for up to 10 doses for severe pain. 10 tablet 0   No current facility-administered medications for this visit.    REVIEW OF SYSTEMS:   Constitutional: ( - ) fevers, ( - )  chills , ( - ) night sweats Eyes: ( - ) blurriness of vision, ( - ) double vision, ( - ) watery eyes Ears, nose, mouth, throat, and face: ( - ) mucositis, ( - ) sore throat Respiratory: ( - ) cough, ( - ) dyspnea, ( - ) wheezes Cardiovascular: ( - ) palpitation, ( - ) chest discomfort, ( - ) lower extremity swelling Gastrointestinal:  ( - ) nausea, ( - ) heartburn, ( - ) change in bowel habits Skin: ( - ) abnormal skin rashes Lymphatics: ( - ) new lymphadenopathy, ( - ) easy bruising Neurological: ( - ) numbness, (  - ) tingling, ( - ) new weaknesses Behavioral/Psych: ( - ) mood change, ( - ) new changes  All other systems were reviewed with the patient and are negative.  PHYSICAL EXAMINATION:  There were no vitals filed for this visit. There were no vitals filed for this visit.  GENERAL: well appearing *** in NAD  SKIN: skin color, texture, turgor are normal, no rashes or significant lesions EYES: conjunctiva are pink and non-injected, sclera clear LUNGS: clear to auscultation and percussion with normal breathing effort HEART: regular rate & rhythm and no murmurs and no lower extremity edema Musculoskeletal: no cyanosis of digits and no clubbing  PSYCH: alert & oriented x 3, fluent speech NEURO: no focal motor/sensory deficits  LABORATORY DATA:  I have reviewed the data as listed    Latest Ref Rng & Units 12/25/2022    5:04 AM 12/25/2022    2:03 AM 08/25/2022    1:11 PM  CBC  WBC 4.0 - 10.5 K/uL  5.1  3.8   Hemoglobin 12.0 - 15.0 g/dL 7.7  8.7  9.7   Hematocrit 36.0 - 46.0 % 26.4  29.6  32.2   Platelets 150 - 400 K/uL  317  201        Latest Ref Rng & Units 12/25/2022    2:03 AM 08/25/2022    1:11 PM 06/22/2022   11:09 PM  CMP  Glucose 70 - 99 mg/dL 409  95  811   BUN 6 - 20 mg/dL 18  13  14    Creatinine 0.44 - 1.00 mg/dL 9.14  7.82  9.56   Sodium 135 - 145 mmol/L 139  135  139   Potassium 3.5 - 5.1 mmol/L 3.5  3.8  3.4   Chloride 98 - 111 mmol/L 108  107  105   CO2 22 - 32 mmol/L 23  23  26    Calcium 8.9 - 10.3 mg/dL 8.4  8.3  9.4   Total Protein 6.5 - 8.1 g/dL 6.8     Total Bilirubin 0.3 - 1.2 mg/dL 0.3     Alkaline Phos 38 - 126 U/L 72     AST 15 - 41 U/L 15     ALT 0 - 44 U/L 11        ASSESSMENT & PLAN ***  After review of the labs, review of the records, and discussion with the patient the patients findings are most consistent with ***  # Iron Deficiency Anemia 2/2 to GYN Bleeding -- Findings are consistent with iron deficiency anemia secondary to patient's  menorrhagia --Encouraged her to follow-up with OB/GYN for  better control of her menstrual cycles --We will confirm iron deficiency anemia by ordering iron panel and ferritin as well as reticulocytes, CBC, and CMP --Continue ferrous sulfate 325 mg daily with a source of vitamin C --We will plan to proceed with IV iron therapy in order to help bolster the patient's blood counts --Plan for return to clinic in 4 to 6 weeks time after last dose of IV iron   No orders of the defined types were placed in this encounter.   All questions were answered. The patient knows to call the clinic with any problems, questions or concerns.  A total of more than 60 minutes were spent on this encounter with face-to-face time and non-face-to-face time, including preparing to see the patient, ordering tests and/or medications, counseling the patient and coordination of care as outlined above.   Ulysees Barns, MD Department of Hematology/Oncology [facility] Phone: [phone] Pager: [phone] Email: [email or web address]  01/17/2023 7:48 AM

## 2023-01-17 NOTE — Telephone Encounter (Signed)
Patient called regarding today's appointment with Dr. Leonides Schanz. Patient reports that she got the dates confused and will not be able to come in today. Patient knows that a member of our scheduling team will be in touch to get her rescheduled for another day. Patient agreeable to be rescheduled.  Scheduling message sent.

## 2023-01-23 ENCOUNTER — Inpatient Hospital Stay (HOSPITAL_COMMUNITY): Admission: RE | Admit: 2023-01-23 | Payer: BLUE CROSS/BLUE SHIELD | Source: Ambulatory Visit

## 2023-01-31 ENCOUNTER — Inpatient Hospital Stay: Payer: BLUE CROSS/BLUE SHIELD

## 2023-01-31 ENCOUNTER — Inpatient Hospital Stay: Payer: BLUE CROSS/BLUE SHIELD | Attending: Hematology and Oncology | Admitting: Hematology and Oncology

## 2023-01-31 NOTE — Progress Notes (Signed)
No show

## 2023-04-24 ENCOUNTER — Telehealth: Payer: Self-pay | Admitting: Hematology and Oncology

## 2023-04-24 NOTE — Telephone Encounter (Signed)
Spoke with Lupita Leash from referring office advise will be closing referral, due to one cancellation and two no shows.

## 2023-05-15 ENCOUNTER — Non-Acute Institutional Stay (HOSPITAL_COMMUNITY)
Admission: RE | Admit: 2023-05-15 | Discharge: 2023-05-15 | Disposition: A | Discharge status: Home / Self Care | Payer: BLUE CROSS/BLUE SHIELD | Source: Ambulatory Visit | Attending: Obstetrics and Gynecology | Admitting: Obstetrics and Gynecology

## 2023-05-15 DIAGNOSIS — D649 Anemia, unspecified: Secondary | ICD-10-CM | POA: Insufficient documentation

## 2023-05-15 MED ORDER — IRON SUCROSE 500 MG IVPB - SIMPLE MED
500.0000 mg | Freq: Once | INTRAVENOUS | Status: AC
  Administered 2023-05-15: 500 mg via INTRAVENOUS
  Filled 2023-05-15: qty 500

## 2023-05-15 MED ORDER — ACETAMINOPHEN 500 MG PO TABS
1000.0000 mg | ORAL_TABLET | Freq: Once | ORAL | Status: AC
  Administered 2023-05-15: 1000 mg via ORAL
  Filled 2023-05-15: qty 2

## 2023-05-15 MED ORDER — SODIUM CHLORIDE 0.9 % IV SOLN: INTRAVENOUS | Status: DC | PRN

## 2023-05-15 NOTE — Progress Notes (Signed)
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 Patient pre-medicated with 1000 mg Tylenol per order. Patient tolerated infusion well with no adverse reaction. Vital signs stable. AVS offered. Patient to come back in 2 weeks for second infusion and will schedule next appointment at the front desk. Patient alert, oriented and ambulatory at discharge.

## 2023-05-17 ENCOUNTER — Encounter (HOSPITAL_BASED_OUTPATIENT_CLINIC_OR_DEPARTMENT_OTHER): Payer: Self-pay

## 2023-05-17 ENCOUNTER — Emergency Department (HOSPITAL_BASED_OUTPATIENT_CLINIC_OR_DEPARTMENT_OTHER)
Admission: EM | Admit: 2023-05-17 | Discharge: 2023-05-17 | Discharge status: Against Medical Advice | Payer: BLUE CROSS/BLUE SHIELD | Attending: Emergency Medicine | Admitting: Emergency Medicine

## 2023-05-17 ENCOUNTER — Other Ambulatory Visit: Payer: Self-pay

## 2023-05-17 ENCOUNTER — Ambulatory Visit: Admission: EM | Admit: 2023-05-17 | Discharge: 2023-05-17 | Payer: BLUE CROSS/BLUE SHIELD

## 2023-05-17 DIAGNOSIS — R0602 Shortness of breath: Secondary | ICD-10-CM | POA: Insufficient documentation

## 2023-05-17 DIAGNOSIS — R55 Syncope and collapse: Secondary | ICD-10-CM | POA: Diagnosis not present

## 2023-05-17 DIAGNOSIS — Z5321 Procedure and treatment not carried out due to patient leaving prior to being seen by health care provider: Secondary | ICD-10-CM | POA: Insufficient documentation

## 2023-05-17 DIAGNOSIS — R42 Dizziness and giddiness: Secondary | ICD-10-CM | POA: Insufficient documentation

## 2023-05-17 DIAGNOSIS — R531 Weakness: Secondary | ICD-10-CM | POA: Diagnosis not present

## 2023-05-17 LAB — CBC WITH DIFFERENTIAL/PLATELET
Abs Immature Granulocytes: 0.02 10*3/uL (ref 0.00–0.07)
Basophils Absolute: 0 10*3/uL (ref 0.0–0.1)
Basophils Relative: 0 %
Eosinophils Absolute: 0.1 10*3/uL (ref 0.0–0.5)
Eosinophils Relative: 1 %
HCT: 28.1 % — ABNORMAL LOW (ref 36.0–46.0)
Hemoglobin: 8.2 g/dL — ABNORMAL LOW (ref 12.0–15.0)
Immature Granulocytes: 0 %
Lymphocytes Relative: 31 %
Lymphs Abs: 1.6 10*3/uL (ref 0.7–4.0)
MCH: 21.9 pg — ABNORMAL LOW (ref 26.0–34.0)
MCHC: 29.2 g/dL — ABNORMAL LOW (ref 30.0–36.0)
MCV: 75.1 fL — ABNORMAL LOW (ref 80.0–100.0)
Monocytes Absolute: 0.7 10*3/uL (ref 0.1–1.0)
Monocytes Relative: 13 %
Neutro Abs: 2.7 10*3/uL (ref 1.7–7.7)
Neutrophils Relative %: 55 %
Platelets: 212 10*3/uL (ref 150–400)
RBC: 3.74 MIL/uL — ABNORMAL LOW (ref 3.87–5.11)
RDW: 20.9 % — ABNORMAL HIGH (ref 11.5–15.5)
WBC: 5.1 10*3/uL (ref 4.0–10.5)
nRBC: 0 % (ref 0.0–0.2)

## 2023-05-17 LAB — BASIC METABOLIC PANEL
Anion gap: 9 (ref 5–15)
BUN: 12 mg/dL (ref 6–20)
CO2: 24 mmol/L (ref 22–32)
Calcium: 8.6 mg/dL — ABNORMAL LOW (ref 8.9–10.3)
Chloride: 106 mmol/L (ref 98–111)
Creatinine, Ser: 0.75 mg/dL (ref 0.44–1.00)
GFR, Estimated: >60 mL/min (ref >60)
Glucose, Bld: 91 mg/dL (ref 70–99)
Potassium: 3.2 mmol/L — ABNORMAL LOW (ref 3.5–5.1)
Sodium: 139 mmol/L (ref 135–145)

## 2023-05-17 LAB — CBG MONITORING, ED: Glucose-Capillary: 75 mg/dL (ref 70–99)

## 2023-05-17 NOTE — ED Triage Notes (Addendum)
Pt reports she was sent here from Urgent Care with c/o dizziness and shortness of breath. She reports hx of anemia with iron infusions. Last infusion was Monday but verbalized concern that she did not get enough infused. Pts visitor reports she may have had a syncopal episode today when he was on the phone with her.

## 2023-05-17 NOTE — ED Provider Triage Note (Signed)
Emergency Medicine Provider Triage Evaluation Note  Angel Munoz , a 47 y.o. female  was evaluated in triage.  Pt complains of generalized weakness, possible syncope. Patient reports feeling weak and tired today. Was on the phone with family member and lying on the floor, states she kept falling asleep (unsure if she passed out, no falls). Hx of anemia secondary to heavy vaginal bleeding, started iron infusions, last infusion was 2 days ago.   Review of Systems  Positive:  Negative:   Physical Exam  BP 128/78 (BP Location: Left Arm)   Pulse 86   Temp 98.4 F (36.9 C) (Oral)   Resp 18   SpO2 100%  Gen:   Awake, no distress   Resp:  Normal effort  MSK:   Moves extremities without difficulty  Other:    Medical Decision Making  Medically screening exam initiated at 6:34 PM.  Appropriate orders placed.  Angel Munoz was informed that the remainder of the evaluation will be completed by another provider, this initial triage assessment does not replace that evaluation, and the importance of remaining in the ED until their evaluation is complete.     Angel Fend, PA-C 05/17/23 1839

## 2023-05-17 NOTE — ED Triage Notes (Signed)
Pt came in requesting to have her iron levels checked. She states she normally receives blood transfusion and was requesting that here. I let her know we do not do that here. Told her we can redirect her to where she can go. Vitals checked,

## 2023-05-31 ENCOUNTER — Telehealth: Payer: Self-pay | Admitting: Gastroenterology

## 2023-05-31 NOTE — Telephone Encounter (Signed)
Patient with a hx of h pylori.  She is not sure if she completed the recommended tx. Patient was seen by [facility] in 2022 and in 2023.  It was recommended that she have an endo/colon to investigate her continued IDA and be tested again for H. Pylori.  The patient did not follow up with any recommendations.  She is requesting that she return to [facility] for abdominal pain in and burning.  She feels that the h. Pylori has returned.  Dr. Myrtie Neither okay to schedule again with you?

## 2023-05-31 NOTE — Telephone Encounter (Signed)
PT called to schedule an appointment for abdominal pain. She has been seen twice with [facility] since seeing [place]. I advised her that we would need to do a transfer request to schedule. PT became upset and wishes to speak with office manager to determine why this request has to be granted. Please advise.

## 2023-06-01 NOTE — Telephone Encounter (Signed)
Contacted PT to give the decision of her transfer request. She was very upset and feels she is being discriminated against. She wants to have the decision appealed because she was fine with the care she received she just hates that we have appointment times that are too far out. Please advise.

## 2023-06-01 NOTE — Telephone Encounter (Signed)
Thank you for the note.  This patient was unhappy with the care she received at her one visit with our office in June 2022, and changed care to the [facility] practice immediately thereafter.  I will politely decline to accept the return of care to [facility].  - H. Danis

## 2023-06-01 NOTE — Telephone Encounter (Signed)
Patient was notified that the decision is final and that she should remain at [facility] where she has established care. She stated that I did not have the authority to make this decision.  I reiterated that this was not my decision but the providers and it was based on his review of her chart. She maintains that we did not have a right to look in her chart for documentation without her consent.  I reminded her that she and I had a conversation yesterday about our policy to have our physician review her recent medical records and make a decision on returning to [facility]. Patient states that she is being discriminated against.  She denies that she had the conversation with [facility] that she was unhappy with the care she was provided here. The patient stated that she wanted an upper endoscopy and did not want to take tx so that is why she went to [facility].  I read the notes to her from her 12/24/20 visit with [facility] that stated she was transferring her care because she was not happy with the care that she was provided at another GI in the area.  Patient also states that we did not tell her she had h. Pylori despite the documentation on 12/24/22 that she was notified of the results, prescribed tx, and asked to return in 8 weeks for repeat labs.  The patient was prescribed an alternate tx for her h. Pylori by [facility] that she did not take either.  I apologized that she did not agree with the decision, and encouraged her to contact her providers at [facility] to make an appointment for her GI concerns.

## 2023-06-19 ENCOUNTER — Other Ambulatory Visit: Payer: Self-pay

## 2023-06-19 ENCOUNTER — Encounter (HOSPITAL_BASED_OUTPATIENT_CLINIC_OR_DEPARTMENT_OTHER): Payer: Self-pay

## 2023-06-19 ENCOUNTER — Emergency Department (HOSPITAL_BASED_OUTPATIENT_CLINIC_OR_DEPARTMENT_OTHER)
Admission: EM | Admit: 2023-06-19 | Discharge: 2023-06-20 | Disposition: A | Discharge status: Home / Self Care | Payer: BLUE CROSS/BLUE SHIELD | Attending: Emergency Medicine | Admitting: Emergency Medicine

## 2023-06-19 DIAGNOSIS — R0789 Other chest pain: Secondary | ICD-10-CM | POA: Diagnosis present

## 2023-06-19 LAB — CBC
HCT: 31.9 % — ABNORMAL LOW (ref 36.0–46.0)
Hemoglobin: 9.5 g/dL — ABNORMAL LOW (ref 12.0–15.0)
MCH: 23.6 pg — ABNORMAL LOW (ref 26.0–34.0)
MCHC: 29.8 g/dL — ABNORMAL LOW (ref 30.0–36.0)
MCV: 79.4 fL — ABNORMAL LOW (ref 80.0–100.0)
Platelets: 200 10*3/uL (ref 150–400)
RBC: 4.02 MIL/uL (ref 3.87–5.11)
RDW: 23.9 % — ABNORMAL HIGH (ref 11.5–15.5)
WBC: 4.6 10*3/uL (ref 4.0–10.5)
nRBC: 0 % (ref 0.0–0.2)

## 2023-06-19 LAB — COMPREHENSIVE METABOLIC PANEL
ALT: 14 U/L (ref 0–44)
AST: 16 U/L (ref 15–41)
Albumin: 3.6 g/dL (ref 3.5–5.0)
Alkaline Phosphatase: 72 U/L (ref 38–126)
Anion gap: 6 (ref 5–15)
BUN: 18 mg/dL (ref 6–20)
CO2: 23 mmol/L (ref 22–32)
Calcium: 9.1 mg/dL (ref 8.9–10.3)
Chloride: 104 mmol/L (ref 98–111)
Creatinine, Ser: 0.91 mg/dL (ref 0.44–1.00)
GFR, Estimated: >60 mL/min (ref >60)
Glucose, Bld: 88 mg/dL (ref 70–99)
Potassium: 3.5 mmol/L (ref 3.5–5.1)
Sodium: 133 mmol/L — ABNORMAL LOW (ref 135–145)
Total Bilirubin: 0.6 mg/dL (ref <1.2)
Total Protein: 7.3 g/dL (ref 6.5–8.1)

## 2023-06-19 LAB — LIPASE, BLOOD: Lipase: 30 U/L (ref 11–51)

## 2023-06-19 LAB — TROPONIN I (HIGH SENSITIVITY): Troponin I (High Sensitivity): 3 ng/L (ref <18)

## 2023-06-19 NOTE — ED Triage Notes (Signed)
Pt BIB EMS with c/o CP and epigastric pain that started a few hours ago. Pt received 324mg  of aspirin and tylenol enroute.     124/86 HR 77 99% on RA

## 2023-06-19 NOTE — ED Triage Notes (Addendum)
EMS encode pt is left rib pain (under breast) worse when breathing, EKG unremarkable. Asprin given en route.

## 2023-06-20 ENCOUNTER — Emergency Department (HOSPITAL_BASED_OUTPATIENT_CLINIC_OR_DEPARTMENT_OTHER): Payer: BLUE CROSS/BLUE SHIELD

## 2023-06-20 ENCOUNTER — Non-Acute Institutional Stay (HOSPITAL_COMMUNITY)
Admission: RE | Admit: 2023-06-20 | Discharge: 2023-06-20 | Disposition: A | Discharge status: Home / Self Care | Payer: BLUE CROSS/BLUE SHIELD | Source: Ambulatory Visit

## 2023-06-20 LAB — URINALYSIS, ROUTINE W REFLEX MICROSCOPIC
Bilirubin Urine: NEGATIVE
Glucose, UA: NEGATIVE mg/dL
Ketones, ur: NEGATIVE mg/dL
Leukocytes,Ua: NEGATIVE
Nitrite: NEGATIVE
Protein, ur: NEGATIVE mg/dL
Specific Gravity, Urine: 1.02 (ref 1.005–1.030)
pH: 6.5 (ref 5.0–8.0)

## 2023-06-20 LAB — URINALYSIS, MICROSCOPIC (REFLEX)

## 2023-06-20 LAB — TROPONIN I (HIGH SENSITIVITY): Troponin I (High Sensitivity): 3 ng/L (ref <18)

## 2023-06-20 LAB — PREGNANCY, URINE: Preg Test, Ur: NEGATIVE

## 2023-06-20 LAB — D-DIMER, QUANTITATIVE: D-Dimer, Quant: 0.34 ug{FEU}/mL (ref 0.00–0.50)

## 2023-06-20 MED ORDER — KETOROLAC TROMETHAMINE 60 MG/2ML IM SOLN
30.0000 mg | Freq: Once | INTRAMUSCULAR | Status: DC
  Filled 2023-06-20: qty 2

## 2023-06-20 MED ORDER — ACETAMINOPHEN 500 MG PO TABS
1000.0000 mg | ORAL_TABLET | Freq: Once | ORAL | Status: AC
  Administered 2023-06-20: 1000 mg via ORAL
  Filled 2023-06-20: qty 2

## 2023-06-20 NOTE — ED Provider Notes (Signed)
[facility] Provider Note  CSN: [id number] Arrival date & time: 06/19/23 2230  Chief Complaint(s) Chest Pain  HPI Angel Munoz is a 47 y.o. female     Chest Pain Pain location:  L chest Pain quality: stabbing   Pain radiates to:  Does not radiate Pain severity:  Moderate Onset quality:  Sudden Duration:  5 hours Timing:  Constant Progression:  Unchanged Chronicity:  New Context: breathing   Relieved by:  Nothing Worsened by:  Deep breathing Associated symptoms: no cough, no fever, no heartburn, no lower extremity edema, no nausea, no palpitations and no shortness of breath   Risk factors: obesity   Risk factors: no birth control, no coronary artery disease, no high cholesterol, no hypertension, no immobilization, no prior DVT/PE and no smoking     Past Medical History Past Medical History:  Diagnosis Date   Anemia    Anxiety    Blood transfusion without reported diagnosis    Fibroid    Pica in adults    Patient Active Problem List   Diagnosis Date Noted   Special screening for malignant neoplasms, colon 11/19/2020   Iron deficiency anemia 11/19/2020   History of Helicobacter pylori infection 11/19/2020   Home Medication(s) Prior to Admission medications   Medication Sig Start Date End Date Taking? Authorizing Provider  albuterol (VENTOLIN HFA) 108 (90 Base) MCG/ACT inhaler Inhale 1 puff into the lungs every 6 (six) hours as needed for wheezing or shortness of breath.  05/07/20   [provider]  benzonatate (TESSALON) 100 MG capsule Take 1 capsule (100 mg total) by mouth every 8 (eight) hours. 10/25/21   Curatolo, Adam, DO  dicyclomine (BENTYL) 20 MG tablet Take 1 tablet (20 mg total) by mouth 3 (three) times daily before meals. 06/23/22   Gilda Crease, MD  famotidine (PEPCID) 40 MG tablet TAKE 1 TABLET BY MOUTH TWICE A DAY 01/28/21   Zehr, Shanda Bumps D, PA-C  ferrous sulfate 325 (65 FE) MG  tablet Take 1 tablet (325 mg total) by mouth daily. 08/25/22   Mardene Sayer, MD  hydrocortisone (ANUSOL-HC) 25 MG suppository Place 1 suppository (25 mg total) rectally 2 (two) times daily. For 7 days 06/23/22   Gilda Crease, MD  methocarbamol (ROBAXIN) 500 MG tablet Take 1 tablet (500 mg total) by mouth every 8 (eight) hours as needed for muscle spasms. 01/22/21   Benjiman Core, MD  naproxen (NAPROSYN) 375 MG tablet Take 1 tablet (375 mg total) by mouth 2 (two) times daily with a meal. 11/21/20   Harris, Abigail, PA-C  omeprazole (PRILOSEC) 20 MG capsule Take 40 mg by mouth daily. 05/07/20   [provider]  ondansetron (ZOFRAN ODT) 4 MG disintegrating tablet Take 1 tablet (4 mg total) by mouth every 8 (eight) hours as needed for nausea or vomiting. 05/19/21   Milagros Loll, MD  ondansetron (ZOFRAN-ODT) 4 MG disintegrating tablet Take 1 tablet (4 mg total) by mouth every 8 (eight) hours as needed. 12/25/22   Mardene Sayer, MD  oxyCODONE (ROXICODONE) 5 MG immediate release tablet Take 1 tablet (5 mg total) by mouth every 6 (six) hours as needed for up to 10 doses for severe pain. 12/25/22   Mardene Sayer, MD  Allergies Iron dextran  Review of Systems Review of Systems  Constitutional:  Negative for fever.  Respiratory:  Negative for cough and shortness of breath.   Cardiovascular:  Positive for chest pain. Negative for palpitations.  Gastrointestinal:  Negative for heartburn and nausea.   As noted in HPI  Physical Exam Vital Signs  I have reviewed the triage vital signs BP (!) 132/90 (BP Location: Right Arm)   Pulse 74   Temp 97.9 F (36.6 C) (Oral)   Resp 20   Ht 5\' 7"  (1.702 m)   Wt 90.7 kg   SpO2 100%   BMI 31.32 kg/m   Physical Exam Vitals reviewed.  Constitutional:      General: She is not in acute  distress.    Appearance: She is well-developed. She is not diaphoretic.  HENT:     Head: Normocephalic and atraumatic.     Nose: Nose normal.  Eyes:     General: No scleral icterus.       Right eye: No discharge.        Left eye: No discharge.     Conjunctiva/sclera: Conjunctivae normal.     Pupils: Pupils are equal, round, and reactive to light.  Cardiovascular:     Rate and Rhythm: Normal rate and regular rhythm.     Heart sounds: No murmur heard.    No friction rub. No gallop.  Pulmonary:     Effort: Pulmonary effort is normal. No respiratory distress.     Breath sounds: Normal breath sounds. No stridor. No rales.  Chest:    Abdominal:     General: There is no distension.     Palpations: Abdomen is soft.     Tenderness: There is no abdominal tenderness.  Musculoskeletal:        General: No tenderness.     Cervical back: Normal range of motion and neck supple.  Skin:    General: Skin is warm and dry.     Findings: No erythema or rash.  Neurological:     Mental Status: She is alert and oriented to person, place, and time.     ED Results and Treatments Labs (all labs ordered are listed, but only abnormal results are displayed) Labs Reviewed  CBC - Abnormal; Notable for the following components:      Result Value   Hemoglobin 9.5 (*)    HCT 31.9 (*)    MCV 79.4 (*)    MCH 23.6 (*)    MCHC 29.8 (*)    RDW 23.9 (*)    All other components within normal limits  COMPREHENSIVE METABOLIC PANEL - Abnormal; Notable for the following components:   Sodium 133 (*)    All other components within normal limits  URINALYSIS, ROUTINE W REFLEX MICROSCOPIC - Abnormal; Notable for the following components:   Hgb urine dipstick SMALL (*)    All other components within normal limits  URINALYSIS, MICROSCOPIC (REFLEX) - Abnormal; Notable for the following components:   Bacteria, UA FEW (*)    All other components within normal limits  PREGNANCY, URINE  LIPASE, BLOOD  D-DIMER,  QUANTITATIVE  TROPONIN I (HIGH SENSITIVITY)  TROPONIN I (HIGH SENSITIVITY)  EKG  EKG Interpretation Date/Time:  Tuesday June 20 2023 00:43:08 EST Ventricular Rate:  58 PR Interval:  172 QRS Duration:  80 QT Interval:  426 QTC Calculation: 419 R Axis:   60  Text Interpretation: Sinus rhythm Borderline T abnormalities, anterior leads No significant change was found Confirmed by Drema Pry [phone]) on 06/20/2023 1:03:18 AM       Radiology DG Chest 2 View  Result Date: 06/20/2023 CLINICAL DATA:  Chest pain EXAM: CHEST - 2 VIEW COMPARISON:  08/25/2022 FINDINGS: Lungs are clear.  No pleural effusion or pneumothorax. The heart is normal in size. Visualized osseous structures are within normal limits. IMPRESSION: Normal chest radiographs. Electronically Signed   By: Charline Bills M.D.   On: 06/20/2023 00:49    Medications Ordered in ED Medications  ketorolac (TORADOL) injection 30 mg (has no administration in time range)  acetaminophen (TYLENOL) tablet 1,000 mg (has no administration in time range)   Procedures Procedures  (including critical care time) Medical Decision Making / ED Course   Medical Decision Making Amount and/or Complexity of Data Reviewed Labs: ordered. Decision-making details documented in ED Course. Radiology: ordered and independent interpretation performed. Decision-making details documented in ED Course. ECG/medicine tests: ordered and independent interpretation performed. Decision-making details documented in ED Course.    Left-sided chest pain differential diagnosis and workup listed below  EKG without acute ischemic changes or evidence of pericarditis.  Serial troponins negative x 2. Unlikely ACS. Dimer negative unlikely PE. Presentation not classic for aortic dissection or esophageal perforation. CBC without leukocytosis.   Chest x-ray without evidence of pneumonia, pneumothorax, pulmonary edema or pleural effusions. Patient does have anemia but hemoglobin is improved from prior.  Favoring chest wall pain over GI etiology.    Final Clinical Impression(s) / ED Diagnoses Final diagnoses:  Atypical chest pain  The patient appears reasonably screened and/or stabilized for discharge and I doubt any other medical condition or other [facility] requiring further screening, evaluation, or treatment in the ED at this time. I have discussed the findings, Dx and Tx plan with the patient/family who expressed understanding and agree(s) with the plan. Discharge instructions discussed at length. The patient/family was given strict return precautions who verbalized understanding of the instructions. No further questions at time of discharge.  Disposition: Discharge  Condition: Good  ED Discharge Orders     None         Follow Up: Primary care provider  Call  to schedule an appointment for close follow up    This chart was dictated using voice recognition software.  Despite best efforts to proofread,  errors can occur which can change the documentation meaning.    Nira Conn, MD 06/20/23 [phone]

## 2023-06-20 NOTE — Discharge Instructions (Addendum)
 For pain control you may take 1000 mg of acetaminophen (Tylenol) every 8 hours and/or 600 mg of Ibuprofen (Motrin, Advil, etc.) every 6-8 hours as needed.  Please limit acetaminophen (Tylenol) to 4000 mg and Ibuprofen (Motrin, Advil, etc.) to 2400 mg for a 24hr period. Please note that other over-the-counter medicine may contain acetaminophen or ibuprofen as a component of their ingredients.

## 2023-06-23 ENCOUNTER — Non-Acute Institutional Stay (HOSPITAL_COMMUNITY)
Admission: RE | Admit: 2023-06-23 | Discharge: 2023-06-23 | Disposition: A | Discharge status: Home / Self Care | Payer: BLUE CROSS/BLUE SHIELD | Source: Ambulatory Visit | Attending: Internal Medicine

## 2023-06-23 ENCOUNTER — Encounter (HOSPITAL_COMMUNITY): Payer: BLUE CROSS/BLUE SHIELD

## 2023-06-23 DIAGNOSIS — D509 Iron deficiency anemia, unspecified: Secondary | ICD-10-CM | POA: Diagnosis present

## 2023-06-23 MED ORDER — SODIUM CHLORIDE 0.9 % IV SOLN: INTRAVENOUS | Status: DC | PRN

## 2023-06-23 MED ORDER — IRON SUCROSE 500 MG IVPB - SIMPLE MED
500.0000 mg | Freq: Once | INTRAVENOUS | Status: AC
  Administered 2023-06-23: 500 mg via INTRAVENOUS
  Filled 2023-06-23: qty 500

## 2023-06-23 MED ORDER — ACETAMINOPHEN 500 MG PO TABS
1000.0000 mg | ORAL_TABLET | Freq: Once | ORAL | Status: AC
  Administered 2023-06-23: 1000 mg via ORAL
  Filled 2023-06-23: qty 2

## 2023-06-23 NOTE — Progress Notes (Signed)
PATIENT CARE CENTER NOTE     Diagnosis: Iron Deficiency Anemia D50.9     Provider: Sanda Klein, PA     Procedure: Venofer infusion      Note: Patient received Venofer 500 mg infusion (dose # 2 of 2) via PIV. Since it has been over 1 month since last infusion, notified [facility] and verified with Dr. Fay Records that it is alright to use original order for second dose. Per Dr. Fay Records, alright to use original order. Patient pre-medicated with 1000 mg Tylenol per order. Patient tolerated infusion well with no adverse reaction. Vital signs stable. AVS offered but patient refused. Patient alert, oriented and ambulatory at discharge. Discharged home with husband.

## 2023-08-18 DIAGNOSIS — J3089 Other allergic rhinitis: Secondary | ICD-10-CM | POA: Diagnosis not present

## 2023-08-18 DIAGNOSIS — K219 Gastro-esophageal reflux disease without esophagitis: Secondary | ICD-10-CM | POA: Diagnosis not present

## 2023-08-18 DIAGNOSIS — H1045 Other chronic allergic conjunctivitis: Secondary | ICD-10-CM | POA: Diagnosis not present

## 2023-08-18 DIAGNOSIS — R052 Subacute cough: Secondary | ICD-10-CM | POA: Diagnosis not present

## 2023-10-16 ENCOUNTER — Emergency Department (HOSPITAL_BASED_OUTPATIENT_CLINIC_OR_DEPARTMENT_OTHER)

## 2023-10-16 ENCOUNTER — Emergency Department (HOSPITAL_BASED_OUTPATIENT_CLINIC_OR_DEPARTMENT_OTHER)
Admission: EM | Admit: 2023-10-16 | Discharge: 2023-10-17 | Disposition: A | Discharge status: Home / Self Care | Attending: Emergency Medicine | Admitting: Emergency Medicine

## 2023-10-16 ENCOUNTER — Encounter (HOSPITAL_BASED_OUTPATIENT_CLINIC_OR_DEPARTMENT_OTHER): Payer: Self-pay | Admitting: Emergency Medicine

## 2023-10-16 ENCOUNTER — Other Ambulatory Visit: Payer: Self-pay

## 2023-10-16 DIAGNOSIS — R1030 Lower abdominal pain, unspecified: Secondary | ICD-10-CM | POA: Insufficient documentation

## 2023-10-16 LAB — CBC WITH DIFFERENTIAL/PLATELET
Abs Immature Granulocytes: 0 10*3/uL (ref 0.00–0.07)
Basophils Absolute: 0 10*3/uL (ref 0.0–0.1)
Basophils Relative: 0 %
Eosinophils Absolute: 0.1 10*3/uL (ref 0.0–0.5)
Eosinophils Relative: 2 %
HCT: 34.7 % — ABNORMAL LOW (ref 36.0–46.0)
Hemoglobin: 11.3 g/dL — ABNORMAL LOW (ref 12.0–15.0)
Immature Granulocytes: 0 %
Lymphocytes Relative: 36 %
Lymphs Abs: 1.9 10*3/uL (ref 0.7–4.0)
MCH: 27.7 pg (ref 26.0–34.0)
MCHC: 32.6 g/dL (ref 30.0–36.0)
MCV: 85 fL (ref 80.0–100.0)
Monocytes Absolute: 0.5 10*3/uL (ref 0.1–1.0)
Monocytes Relative: 11 %
Neutro Abs: 2.6 10*3/uL (ref 1.7–7.7)
Neutrophils Relative %: 51 %
Platelets: 224 10*3/uL (ref 150–400)
RBC: 4.08 MIL/uL (ref 3.87–5.11)
RDW: 15.9 % — ABNORMAL HIGH (ref 11.5–15.5)
WBC: 5.2 10*3/uL (ref 4.0–10.5)
nRBC: 0 % (ref 0.0–0.2)

## 2023-10-16 LAB — COMPREHENSIVE METABOLIC PANEL WITH GFR
ALT: 18 U/L (ref 0–44)
AST: 20 U/L (ref 15–41)
Albumin: 3.7 g/dL (ref 3.5–5.0)
Alkaline Phosphatase: 78 U/L (ref 38–126)
Anion gap: 7 (ref 5–15)
BUN: 17 mg/dL (ref 6–20)
CO2: 23 mmol/L (ref 22–32)
Calcium: 8.7 mg/dL — ABNORMAL LOW (ref 8.9–10.3)
Chloride: 105 mmol/L (ref 98–111)
Creatinine, Ser: 0.77 mg/dL (ref 0.44–1.00)
GFR, Estimated: >60 mL/min (ref >60)
Glucose, Bld: 93 mg/dL (ref 70–99)
Potassium: 3.6 mmol/L (ref 3.5–5.1)
Sodium: 135 mmol/L (ref 135–145)
Total Bilirubin: 0.3 mg/dL (ref 0.0–1.2)
Total Protein: 7.7 g/dL (ref 6.5–8.1)

## 2023-10-16 LAB — URINALYSIS, ROUTINE W REFLEX MICROSCOPIC
Bilirubin Urine: NEGATIVE
Glucose, UA: NEGATIVE mg/dL
Hgb urine dipstick: NEGATIVE
Ketones, ur: NEGATIVE mg/dL
Leukocytes,Ua: NEGATIVE
Nitrite: NEGATIVE
Protein, ur: NEGATIVE mg/dL
Specific Gravity, Urine: 1.01 (ref 1.005–1.030)
pH: 6 (ref 5.0–8.0)

## 2023-10-16 LAB — LIPASE, BLOOD: Lipase: 34 U/L (ref 11–51)

## 2023-10-16 LAB — PREGNANCY, URINE: Preg Test, Ur: NEGATIVE

## 2023-10-16 MED ORDER — IOHEXOL 300 MG/ML  SOLN
100.0000 mL | Freq: Once | INTRAMUSCULAR | Status: AC | PRN
  Administered 2023-10-16: 100 mL via INTRAVENOUS

## 2023-10-16 NOTE — ED Provider Notes (Signed)
 [facility] Provider Note   CSN: [id number] Arrival date & time: 10/16/23  1707     History  Chief Complaint  Patient presents with   Urinary Frequency   Abdominal Pain    Lower     Angel Munoz is a 48 y.o. female.  This is a 48 year old female who is presenting to the emergency room today due to lower abdominal pain.  Patient reports that she has been having increasing urinary frequency, pain in her lower abdomen which seems to improve with bowel movements.  Patient describes these symptoms being worse in the evening.  She does have a history of fibroids, followed up with her OB/GYN 1 week ago, had ultrasound imaging done at that time which she was told was normal.  She was started on Provera for frequent breakthrough bleeding.  She has not had any fever, chills, vaginal discharge.  She was recently treated for a UTI, denies any dysuria at this time.   Urinary Frequency Associated symptoms include abdominal pain.  Abdominal Pain      Home Medications Prior to Admission medications   Medication Sig Start Date End Date Taking? Authorizing Provider  albuterol (VENTOLIN HFA) 108 (90 Base) MCG/ACT inhaler Inhale 1 puff into the lungs every 6 (six) hours as needed for wheezing or shortness of breath.  05/07/20   [provider]  benzonatate (TESSALON) 100 MG capsule Take 1 capsule (100 mg total) by mouth every 8 (eight) hours. 10/25/21   Curatolo, Adam, DO  dicyclomine (BENTYL) 20 MG tablet Take 1 tablet (20 mg total) by mouth 3 (three) times daily before meals. 06/23/22   Gilda Crease, MD  famotidine (PEPCID) 40 MG tablet TAKE 1 TABLET BY MOUTH TWICE A DAY 01/28/21   Zehr, Shanda Bumps D, PA-C  ferrous sulfate 325 (65 FE) MG tablet Take 1 tablet (325 mg total) by mouth daily. 08/25/22   Mardene Sayer, MD  hydrocortisone (ANUSOL-HC) 25 MG suppository Place 1 suppository (25 mg total) rectally 2 (two) times  daily. For 7 days 06/23/22   Gilda Crease, MD  methocarbamol (ROBAXIN) 500 MG tablet Take 1 tablet (500 mg total) by mouth every 8 (eight) hours as needed for muscle spasms. 01/22/21   Benjiman Core, MD  naproxen (NAPROSYN) 375 MG tablet Take 1 tablet (375 mg total) by mouth 2 (two) times daily with a meal. 11/21/20   Harris, Abigail, PA-C  omeprazole (PRILOSEC) 20 MG capsule Take 40 mg by mouth daily. 05/07/20   [provider]  ondansetron (ZOFRAN ODT) 4 MG disintegrating tablet Take 1 tablet (4 mg total) by mouth every 8 (eight) hours as needed for nausea or vomiting. 05/19/21   Milagros Loll, MD  ondansetron (ZOFRAN-ODT) 4 MG disintegrating tablet Take 1 tablet (4 mg total) by mouth every 8 (eight) hours as needed. 12/25/22   Mardene Sayer, MD  oxyCODONE (ROXICODONE) 5 MG immediate release tablet Take 1 tablet (5 mg total) by mouth every 6 (six) hours as needed for up to 10 doses for severe pain. 12/25/22   Mardene Sayer, MD      Allergies    Iron dextran    Review of Systems   Review of Systems  Gastrointestinal:  Positive for abdominal pain.  Genitourinary:  Positive for frequency.    Physical Exam Updated Vital Signs BP 120/87   Pulse 76   Temp 97.6 F (36.4 C) (Oral)   Resp 20   Wt  99.8 kg   LMP 10/10/2023 (Approximate)   SpO2 99%   BMI 34.46 kg/m  Physical Exam Vitals reviewed.  HENT:     Head: Normocephalic.  Cardiovascular:     Rate and Rhythm: Normal rate.  Abdominal:     General: Abdomen is flat. Bowel sounds are normal.     Palpations: Abdomen is soft.     Tenderness: There is no abdominal tenderness. There is no right CVA tenderness or left CVA tenderness.     Hernia: No hernia is present.  Neurological:     Mental Status: She is alert.     ED Results / Procedures / Treatments   Labs (all labs ordered are listed, but only abnormal results are displayed) Labs Reviewed  COMPREHENSIVE METABOLIC PANEL WITH GFR - Abnormal;  Notable for the following components:      Result Value   Calcium 8.7 (*)    All other components within normal limits  CBC WITH DIFFERENTIAL/PLATELET - Abnormal; Notable for the following components:   Hemoglobin 11.3 (*)    HCT 34.7 (*)    RDW 15.9 (*)    All other components within normal limits  URINALYSIS, ROUTINE W REFLEX MICROSCOPIC  PREGNANCY, URINE  LIPASE, BLOOD    EKG None  Radiology No results found.  Procedures Procedures    Medications Ordered in ED Medications  iohexol (OMNIPAQUE) 300 MG/ML solution 100 mL (100 mLs Intravenous Contrast Given 10/16/23 2330)    ED Course/ Medical Decision Making/ A&P                                 Medical Decision Making 48 year old female here today for lower abdominal pain.  Differential diagnoses include irritable bowel, diverticulitis, uterine fibroids, abdominal mass, less likely appendicitis, less likely cystitis, less likely torsion.  Plan- on exam, patient overall looks well.  When I enter the room, she is upright working on her computer.  She has no significant abdominal tenderness, does describe fullness which seems to improve with defecation.  She has a soft abdomen, normal vital signs.  Urinalysis negative for infection.  Will obtain CT imaging of the patient's abdomen pelvis.  Lower suspicion for uterine or ovarian pathology given her recent outpatient ultrasound.  Patient will be signed out to Dr. Adela Lank pending CT imaging.  Amount and/or Complexity of Data Reviewed Labs: ordered. Radiology: ordered.  Risk Prescription drug management.           Final Clinical Impression(s) / ED Diagnoses Final diagnoses:  Lower abdominal pain    Rx / DC Orders ED Discharge Orders     None         Arletha Pili, DO 10/16/23 2332

## 2023-10-16 NOTE — ED Triage Notes (Signed)
 Urinary frequency and lower abd pain x 1 week , recent UTI .

## 2023-10-17 NOTE — Discharge Instructions (Addendum)
 Please follow up with your gi doc and family doc in the office.  Take 4 over the counter ibuprofen tablets 3 times a day or 2 over-the-counter naproxen tablets twice a day for pain. Also take tylenol 1000mg (2 extra strength) four times a day.

## 2023-10-18 IMAGING — CT CT ABD-PELV W/ CM
2 of 5 series · 16 of 46 positions shown, 18 images · IV contrast (Omnipaque)
Comparison: CT scan 01/22/2021

CLINICAL DATA: Right lower quadrant abdominal pain.

EXAM:
CT ABDOMEN AND PELVIS WITH CONTRAST
TECHNIQUE: Multidetector CT imaging of the abdomen and pelvis was performed
using the standard protocol following bolus administration of
intravenous contrast.
CONTRAST:  100mL OMNIPAQUE IOHEXOL 300 MG/ML  SOLN

[Series 2: axial st · axial · 0.98mm/px · z∈[-512,-92]mm · 13 of 94 slices shown, 15 images]
[im 5/94  soft-tissue]
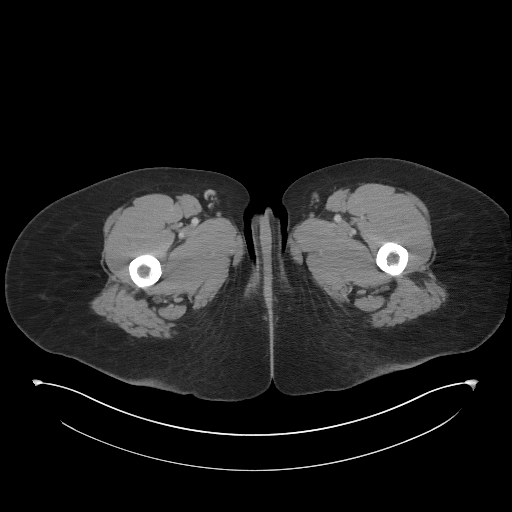
[im 5/94  bone]
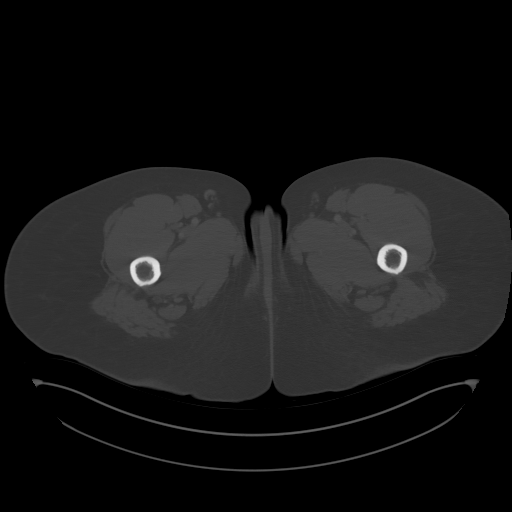
[im 15/94  soft-tissue]
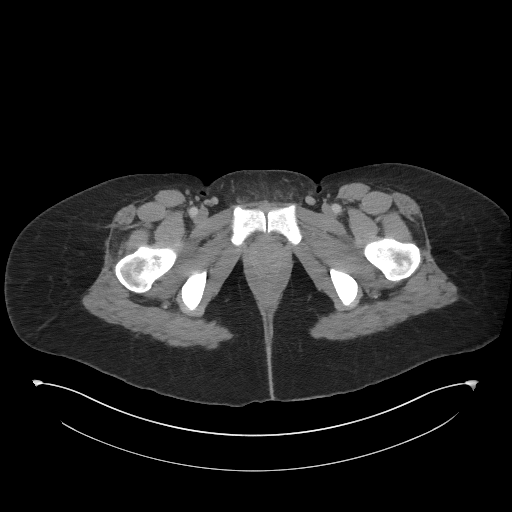
[im 20/94  soft-tissue]
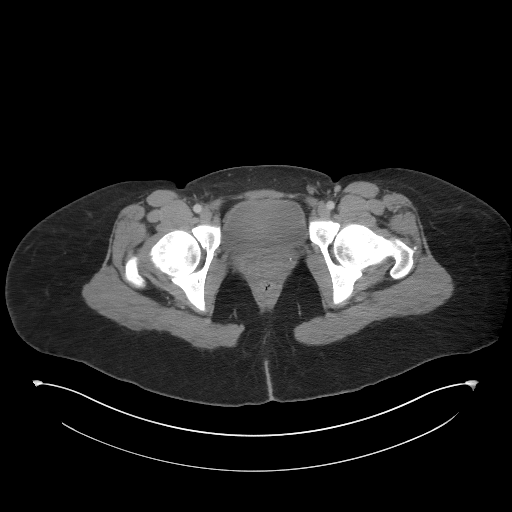
[im 25/94  soft-tissue]
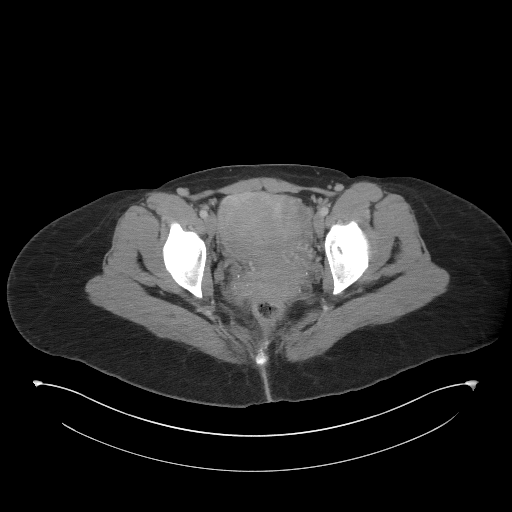
[im 35/94  soft-tissue]
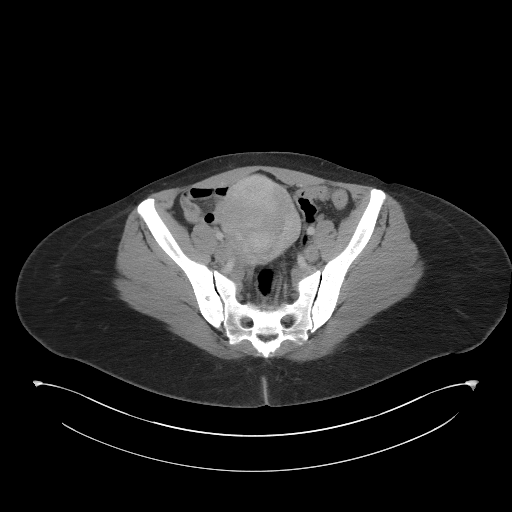
[im 40/94  soft-tissue]
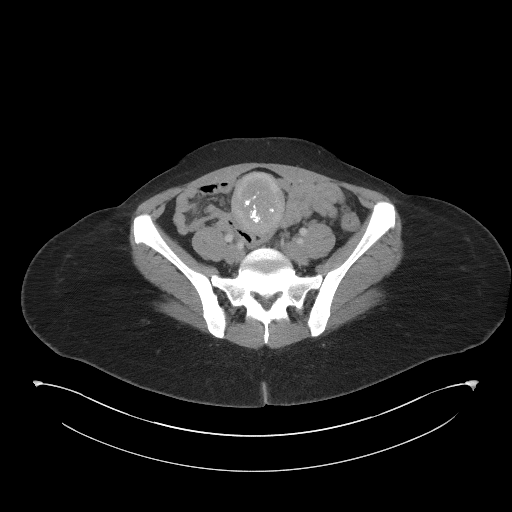
[im 49/94  soft-tissue]
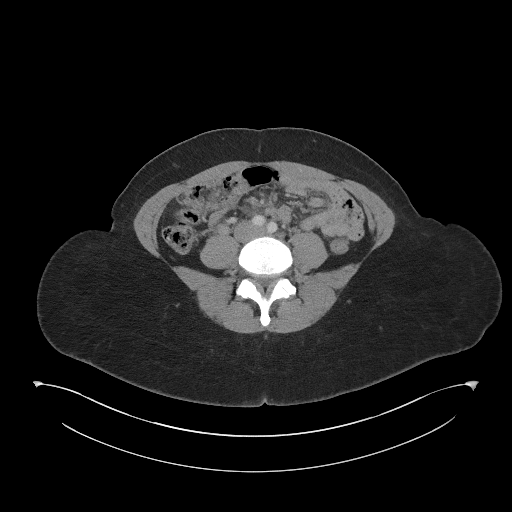
[im 54/94  soft-tissue]
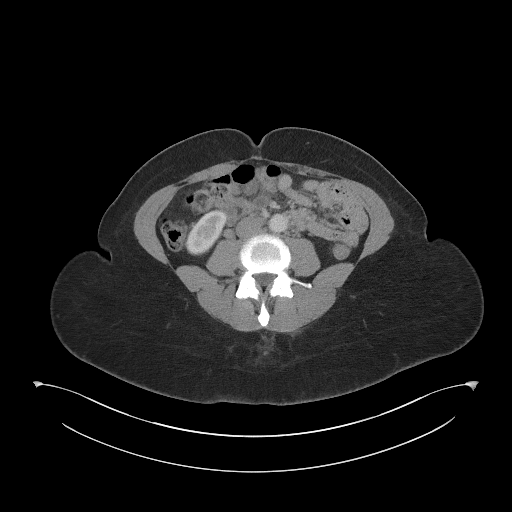
[im 59/94  soft-tissue]
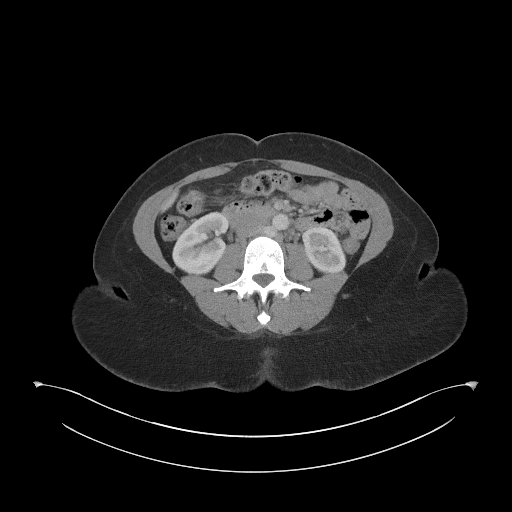
[im 59/94  bone]
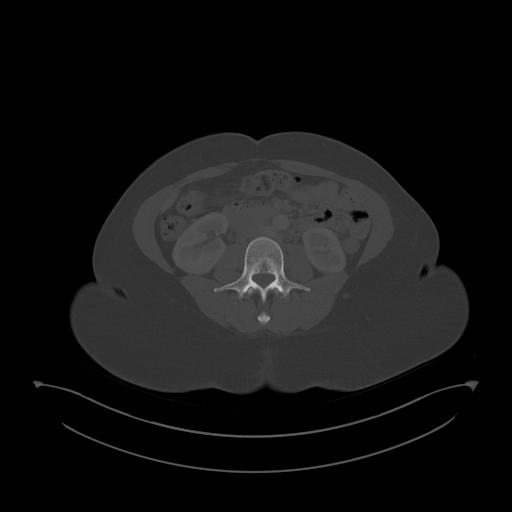
[im 69/94  soft-tissue]
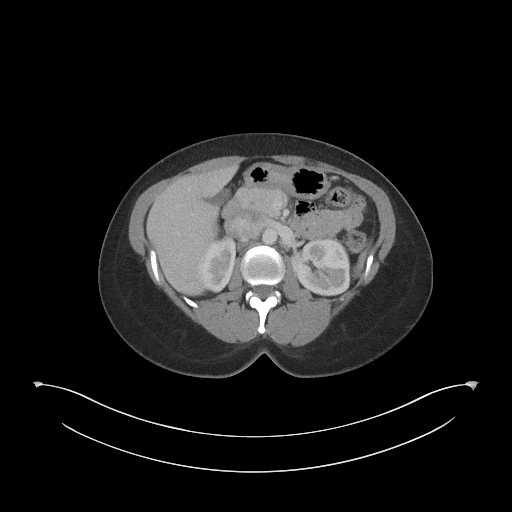
[im 74/94  soft-tissue]
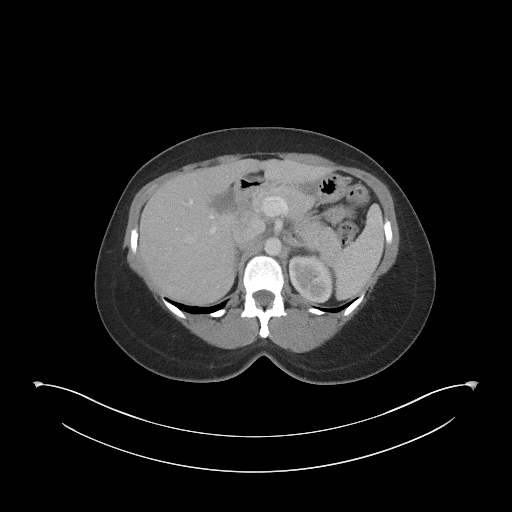
[im 79/94  soft-tissue]
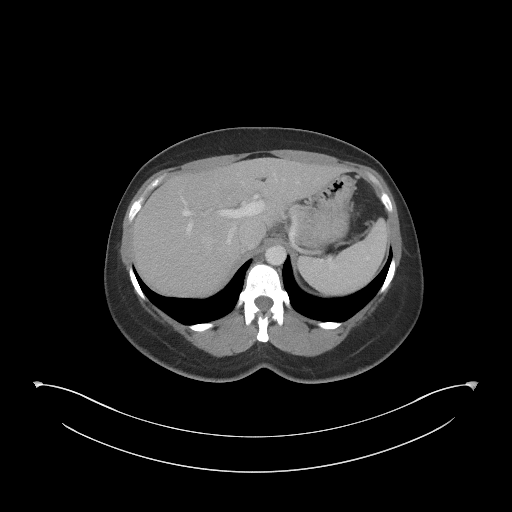
[im 89/94  soft-tissue]
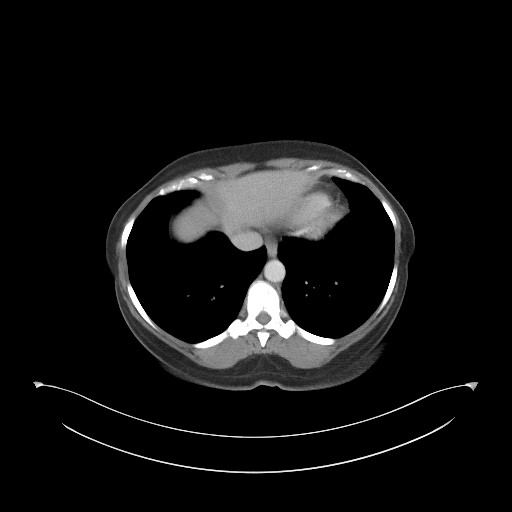

[Series 5: coronal st · coronal · 0.94mm/px · 3 of 92 slices shown]
[im 31/92  soft-tissue]
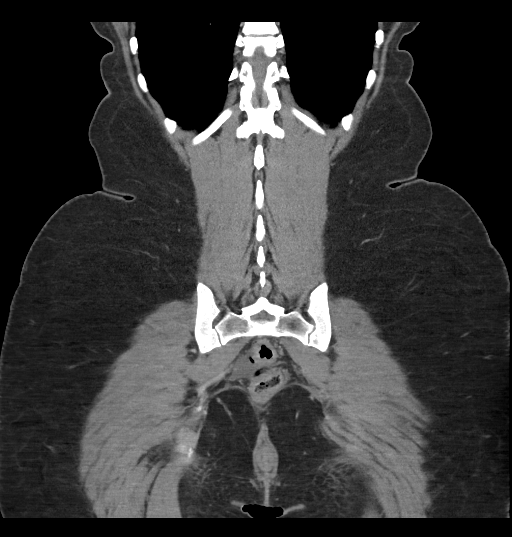
[im 41/92  soft-tissue]
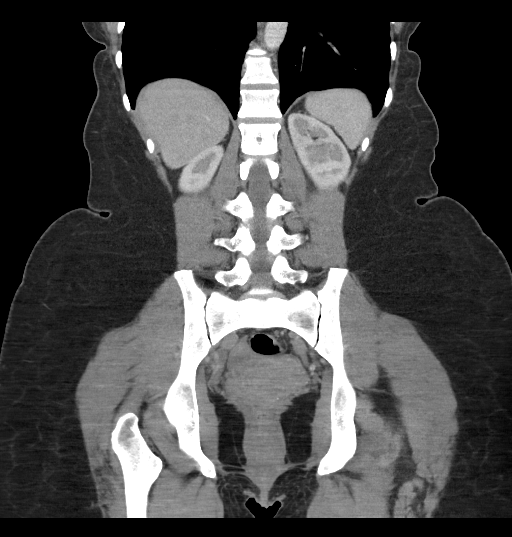
[im 51/92  soft-tissue]
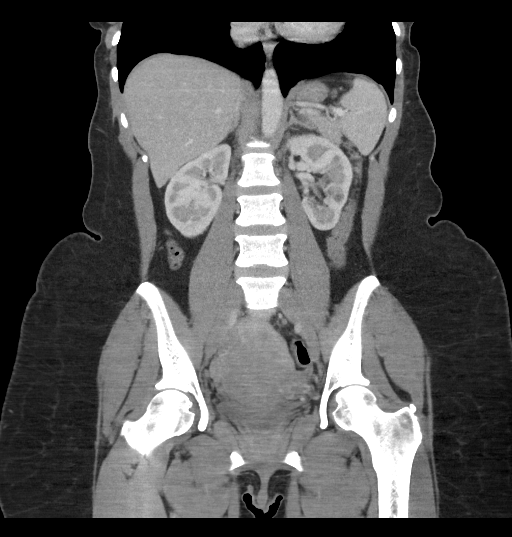

[16 of 46 positions shown; findings below may reference images not displayed]

FINDINGS: Lower chest: The lung bases are clear of acute process. No pleural
effusion or pulmonary lesions. The heart is normal in size. No
pericardial effusion. The distal esophagus and aorta are
unremarkable.

Hepatobiliary: No hepatic lesions or intrahepatic biliary
dilatation. Stable calcified granuloma in the right hepatic lobe
posteriorly. Gallbladder is unremarkable. No common bile duct
dilatation.

Pancreas: No mass, inflammation or ductal dilatation.

Spleen: Normal size. No focal lesions.

Adrenals/Urinary Tract: The adrenal glands are unremarkable. No
worrisome renal lesions or hydronephrosis. Simple left renal cysts.
The delayed images do not demonstrate any significant collecting
system abnormalities. The bladder is unremarkable.

Stomach/Bowel: The stomach, duodenum, small bowel and colon are
unremarkable. No acute inflammatory changes, mass lesions
obstructive findings. The terminal ileum is normal. The appendix is
normal.

Vascular/Lymphatic: The aorta and branch vessels are normal. The
major venous structures are patent. No mesenteric or retroperitoneal
mass or adenopathy.

Reproductive: Stable enlarged fibroid uterus. The fundal fibroid is
degenerated and demonstrates internal calcifications. The
endometrium is displaced to the left but is normal in thickness.
There is a simple 3 cm right ovarian cyst. No follow-up imaging
recommended. Note: This recommendation does not apply to
premenarchal patients and to those with increased risk (genetic,
family history, elevated tumor markers or other high-risk factors)
of ovarian cancer. Reference: JACR [DATE]):[id number] The left
ovarian cyst seen on the prior CT scan has resolved.

Other: No pelvic mass or adenopathy. Small amount of free pelvic
fluid is likely physiologic. A leaking ovarian cyst is also
possible. No inguinal mass or adenopathy. No abdominal wall hernia
or subcutaneous lesions.

Musculoskeletal: No significant bony findings.
IMPRESSION: 1. No acute abdominal/pelvic findings, mass lesions or adenopathy.
2. Stable enlarged fibroid uterus.
3. Simple 3 cm right ovarian cyst.
4. Small amount of free pelvic fluid is likely physiologic. A
leaking ovarian cyst is also possible.

## 2023-10-19 DIAGNOSIS — R140 Abdominal distension (gaseous): Secondary | ICD-10-CM | POA: Insufficient documentation

## 2023-10-19 DIAGNOSIS — K5909 Other constipation: Secondary | ICD-10-CM | POA: Insufficient documentation

## 2023-10-19 DIAGNOSIS — A048 Other specified bacterial intestinal infections: Secondary | ICD-10-CM | POA: Insufficient documentation

## 2023-12-21 ENCOUNTER — Encounter: Admitting: Obstetrics and Gynecology

## 2023-12-28 ENCOUNTER — Other Ambulatory Visit: Payer: Self-pay | Admitting: Obstetrics and Gynecology

## 2023-12-28 DIAGNOSIS — D649 Anemia, unspecified: Secondary | ICD-10-CM | POA: Insufficient documentation

## 2023-12-28 NOTE — Progress Notes (Signed)
 Patient's MD Cathryn Cobb would like the patient to receive iron  infusions for her anemia. Will place a therapy plan for Venofer  200 mg IV x 5   Othar Curto D. Ralpheal Zappone, PharmD

## 2024-01-09 ENCOUNTER — Other Ambulatory Visit: Payer: Self-pay | Admitting: Interventional Radiology

## 2024-01-09 ENCOUNTER — Other Ambulatory Visit: Payer: Self-pay | Admitting: Obstetrics and Gynecology

## 2024-01-09 DIAGNOSIS — D259 Leiomyoma of uterus, unspecified: Secondary | ICD-10-CM

## 2024-01-10 ENCOUNTER — Ambulatory Visit
Admission: RE | Admit: 2024-01-10 | Discharge: 2024-01-10 | Disposition: A | Discharge status: Home / Self Care | Source: Ambulatory Visit | Attending: Interventional Radiology | Admitting: Interventional Radiology

## 2024-01-10 ENCOUNTER — Other Ambulatory Visit (HOSPITAL_COMMUNITY)

## 2024-01-10 DIAGNOSIS — D259 Leiomyoma of uterus, unspecified: Secondary | ICD-10-CM

## 2024-01-10 MED ORDER — GADOPICLENOL 0.5 MMOL/ML IV SOLN
10.0000 mL | Freq: Once | INTRAVENOUS | Status: AC | PRN
Start: 2024-01-10 — End: 2024-01-10
  Administered 2024-01-10: 10 mL via INTRAVENOUS

## 2024-01-17 ENCOUNTER — Other Ambulatory Visit

## 2024-01-17 NOTE — Progress Notes (Shared)
 Chief Complaint: Patient was seen in consultation today for symptomatic uterine fibroids.   Referring Physician(s): Chen,Jane J  History of Present Illness: Angel Munoz is a 48 y.o. female with a medical history significant for anxiety, anemia and uterine fibroids. She has had iron  deficiency anemia since her 20's, possibly due to heavy menstrual bleeding. She presented to the ED 10/16/23 for evaluation of urinary frequency and lower abdominal pain. CT imaging showed no acute findings but showed large, stable uterine fibroids. It was recommended that she follow up with her OB/GYN.   The patient was evaluated by GI 10/19/23 for anemia and the patient stated her bleeding has improved since starting her most recent form of birth control. However she does complain of bulk symptoms like bloating, pelvic pressure and constipation with incomplete evacuation of stool after bowel movements. She has a history of H. Pylori and her GI team ordered a. H. Pylori breath test and a colonoscopy.  The patient then met with her gynecologist December 21, 2023 and Dr. Laurence discussed possible treatment options for her fibroids. They discussed fibroid ablation procedures, uterine artery embolization and hysterectomy. The patient's religion prohibits a hysterectomy. The patient ultimately elected to pursue uterine artery embolization and an MR pelvis was ordered for further work up. This was completed 01/10/24 and she presents today to discuss these results.   Past Medical History:  Diagnosis Date   Anemia    Anxiety    Blood transfusion without reported diagnosis    Fibroid    Pica in adults     No past surgical history on file.  Allergies: Iron  dextran  Medications: Prior to Admission medications   Medication Sig Start Date End Date Taking? Authorizing Provider  albuterol (VENTOLIN HFA) 108 (90 Base) MCG/ACT inhaler Inhale 1 puff into the lungs every 6 (six) hours as needed for wheezing or  shortness of breath.  05/07/20   [provider]  benzonatate  (TESSALON ) 100 MG capsule Take 1 capsule (100 mg total) by mouth every 8 (eight) hours. 10/25/21   Curatolo, Adam, DO  dicyclomine  (BENTYL ) 20 MG tablet Take 1 tablet (20 mg total) by mouth 3 (three) times daily before meals. 06/23/22   Haze Lonni PARAS, MD  famotidine  (PEPCID ) 40 MG tablet TAKE 1 TABLET BY MOUTH TWICE A DAY 01/28/21   Zehr, Jessica D, PA-C  ferrous sulfate  325 (65 FE) MG tablet Take 1 tablet (325 mg total) by mouth daily. 08/25/22   Ethyl Richerd BROCKS, MD  hydrocortisone  (ANUSOL -HC) 25 MG suppository Place 1 suppository (25 mg total) rectally 2 (two) times daily. For 7 days 06/23/22   Haze Lonni PARAS, MD  methocarbamol  (ROBAXIN ) 500 MG tablet Take 1 tablet (500 mg total) by mouth every 8 (eight) hours as needed for muscle spasms. 01/22/21   Patsey Lot, MD  naproxen  (NAPROSYN ) 375 MG tablet Take 1 tablet (375 mg total) by mouth 2 (two) times daily with a meal. 11/21/20   Harris, Abigail, PA-C  omeprazole (PRILOSEC) 20 MG capsule Take 40 mg by mouth daily. 05/07/20   [provider]  ondansetron  (ZOFRAN  ODT) 4 MG disintegrating tablet Take 1 tablet (4 mg total) by mouth every 8 (eight) hours as needed for nausea or vomiting. 05/19/21   Schuyler Charlie RAMAN, MD  ondansetron  (ZOFRAN -ODT) 4 MG disintegrating tablet Take 1 tablet (4 mg total) by mouth every 8 (eight) hours as needed. 12/25/22   Ethyl Richerd BROCKS, MD  oxyCODONE  (ROXICODONE ) 5 MG immediate release tablet Take 1 tablet (  5 mg total) by mouth every 6 (six) hours as needed for up to 10 doses for severe pain. 12/25/22   Ethyl Richerd BROCKS, MD     No family history on file.  Social History   Socioeconomic History   Marital status: Married    Spouse name: Not on file   Number of children: Not on file   Years of education: Not on file   Highest education level: Not on file  Occupational History   Not on file  Tobacco Use    Smoking status: Never   Smokeless tobacco: Never  Substance and Sexual Activity   Alcohol use: Not Currently   Drug use: Not Currently   Sexual activity: Not Currently  Other Topics Concern   Not on file  Social History Narrative   Not on file   Social Drivers of Health   Financial Resource Strain: Not on file  Food Insecurity: No Food Insecurity (10/05/2020)   Received from [facility]   Hunger Vital Sign    Within the past 12 months, you worried that your food would run out before you got the money to buy more.: Never true    Within the past 12 months, the food you bought just didn't last and you didn't have money to get more.: Never true  Transportation Needs: Not on file  Physical Activity: Not on file  Stress: Not on file  Social Connections: Unknown (11/23/2021)   Received from [facility]   Social Network    Social Network: Not on file     Review of Systems: A 12 point ROS discussed and pertinent positives are indicated in the HPI above.  All other systems are negative.  Review of Systems  Vital Signs: There were no vitals taken for this visit.    Physical Exam  Imaging:      Labs:  CBC: Recent Labs    05/17/23 1845 06/19/23 2254 10/16/23 2254  WBC 5.1 4.6 5.2  HGB 8.2* 9.5* 11.3*  HCT 28.1* 31.9* 34.7*  PLT 212 200 224    COAGS: No results for input(s): INR, APTT in the last 8760 hours.  BMP: Recent Labs    05/17/23 1845 06/19/23 2254 10/16/23 2254  NA 139 133* 135  K 3.2* 3.5 3.6  CL 106 104 105  CO2 24 23 23   GLUCOSE 91 88 93  BUN 12 18 17   CALCIUM 8.6* 9.1 8.7*  CREATININE 0.75 0.91 0.77  GFRNONAA >60 >60 >60    LIVER FUNCTION TESTS: Recent Labs    06/19/23 2254 10/16/23 2254  BILITOT 0.6 0.3  AST 16 20  ALT 14 18  ALKPHOS 72 78  PROT 7.3 7.7  ALBUMIN 3.6 3.7    TUMOR MARKERS: No results for input(s): AFPTM, CEA, CA199, CHROMGRNA in the last 8760 hours.  Assessment and Plan:  48 year old female  with a history of uterine fibroids with iron  deficiency anemia secondary to menorrhagia.  Thank you for this interesting consult.  I greatly enjoyed meeting Angel Munoz and look forward to participating in their care.  A copy of this report was sent to the requesting provider on this date.  Ester Sides, MD Pager: [phone]    I spent a total of  40 Minutes   in face to face in clinical consultation, greater than 50% of which was counseling/coordinating care for symptomatic uterine fibroids.

## 2024-01-19 ENCOUNTER — Inpatient Hospital Stay: Admission: RE | Admit: 2024-01-19 | Source: Ambulatory Visit

## 2024-01-19 DIAGNOSIS — D649 Anemia, unspecified: Secondary | ICD-10-CM | POA: Insufficient documentation

## 2024-01-19 DIAGNOSIS — J4530 Mild persistent asthma, uncomplicated: Secondary | ICD-10-CM | POA: Insufficient documentation

## 2024-01-19 DIAGNOSIS — K219 Gastro-esophageal reflux disease without esophagitis: Secondary | ICD-10-CM | POA: Insufficient documentation

## 2024-01-19 DIAGNOSIS — E669 Obesity, unspecified: Secondary | ICD-10-CM | POA: Insufficient documentation

## 2024-01-22 ENCOUNTER — Inpatient Hospital Stay: Payer: Self-pay

## 2024-01-22 ENCOUNTER — Inpatient Hospital Stay: Attending: Hematology and Oncology | Admitting: Hematology and Oncology

## 2024-01-22 VITALS — BP 134/90 | HR 72 | Temp 98.1°F | Resp 14 | Wt 247.2 lb

## 2024-01-22 DIAGNOSIS — N920 Excessive and frequent menstruation with regular cycle: Secondary | ICD-10-CM | POA: Insufficient documentation

## 2024-01-22 DIAGNOSIS — D500 Iron deficiency anemia secondary to blood loss (chronic): Secondary | ICD-10-CM

## 2024-01-22 DIAGNOSIS — Z0000 Encounter for general adult medical examination without abnormal findings: Secondary | ICD-10-CM

## 2024-01-22 DIAGNOSIS — D259 Leiomyoma of uterus, unspecified: Secondary | ICD-10-CM | POA: Insufficient documentation

## 2024-01-22 LAB — CBC WITH DIFFERENTIAL (CANCER CENTER ONLY)
Abs Immature Granulocytes: 0 K/uL (ref 0.00–0.07)
Basophils Absolute: 0 K/uL (ref 0.0–0.1)
Basophils Relative: 1 %
Eosinophils Absolute: 0.1 K/uL (ref 0.0–0.5)
Eosinophils Relative: 2 %
HCT: 31.5 % — ABNORMAL LOW (ref 36.0–46.0)
Hemoglobin: 9.7 g/dL — ABNORMAL LOW (ref 12.0–15.0)
Immature Granulocytes: 0 %
Lymphocytes Relative: 34 %
Lymphs Abs: 1.5 K/uL (ref 0.7–4.0)
MCH: 24.7 pg — ABNORMAL LOW (ref 26.0–34.0)
MCHC: 30.8 g/dL (ref 30.0–36.0)
MCV: 80.2 fL (ref 80.0–100.0)
Monocytes Absolute: 0.5 K/uL (ref 0.1–1.0)
Monocytes Relative: 11 %
Neutro Abs: 2.3 K/uL (ref 1.7–7.7)
Neutrophils Relative %: 52 %
Platelet Count: 253 K/uL (ref 150–400)
RBC: 3.93 MIL/uL (ref 3.87–5.11)
RDW: 16 % — ABNORMAL HIGH (ref 11.5–15.5)
WBC Count: 4.3 K/uL (ref 4.0–10.5)
nRBC: 0.5 % — ABNORMAL HIGH (ref 0.0–0.2)

## 2024-01-22 LAB — CMP (CANCER CENTER ONLY)
ALT: 12 U/L (ref 0–44)
AST: 17 U/L (ref 15–41)
Albumin: 3.8 g/dL (ref 3.5–5.0)
Alkaline Phosphatase: 79 U/L (ref 38–126)
Anion gap: 5 (ref 5–15)
BUN: 13 mg/dL (ref 6–20)
CO2: 27 mmol/L (ref 22–32)
Calcium: 9.1 mg/dL (ref 8.9–10.3)
Chloride: 107 mmol/L (ref 98–111)
Creatinine: 0.72 mg/dL (ref 0.44–1.00)
GFR, Estimated: >60 mL/min (ref >60)
Glucose, Bld: 96 mg/dL (ref 70–99)
Potassium: 3.6 mmol/L (ref 3.5–5.1)
Sodium: 139 mmol/L (ref 135–145)
Total Bilirubin: 0.3 mg/dL (ref 0.0–1.2)
Total Protein: 7.4 g/dL (ref 6.5–8.1)

## 2024-01-22 LAB — FERRITIN: Ferritin: 8 ng/mL — ABNORMAL LOW (ref 11–307)

## 2024-01-22 LAB — IRON AND IRON BINDING CAPACITY (CC-WL,HP ONLY)
Iron: 21 ug/dL — ABNORMAL LOW (ref 28–170)
Saturation Ratios: 5 % — ABNORMAL LOW (ref 10.4–31.8)
TIBC: 451 ug/dL — ABNORMAL HIGH (ref 250–450)
UIBC: 430 ug/dL (ref 148–442)

## 2024-01-22 LAB — RETIC PANEL
Immature Retic Fract: 29.8 % — ABNORMAL HIGH (ref 2.3–15.9)
RBC.: 3.89 MIL/uL (ref 3.87–5.11)
Retic Count, Absolute: 38.1 K/uL (ref 19.0–186.0)
Retic Ct Pct: 1 % (ref 0.4–3.1)
Reticulocyte Hemoglobin: 24.9 pg — ABNORMAL LOW (ref >27.9)

## 2024-01-22 NOTE — Progress Notes (Unsigned)
 [facility] [phone]   [phone]  INITIAL CONSULT NOTE  Patient Care Team: Patient, No Pcp Per as PCP - General (General Practice)  Hematological/Oncological History # Iron  Deficiency Anemia 2/2 to GYN Bleeding 10/16/2023: WBC 5.2 ,Hgb 11.3, MCV 85, Plt 224 01/22/2024: establish care with Dr. Federico   CHIEF COMPLAINTS/PURPOSE OF CONSULTATION:  Iron  Deficiency Anemia    HISTORY OF PRESENTING ILLNESS:  Angel Munoz 48 y.o. female with medical history significant for fibroids who presents for evaluation of iron  deficiency anemia.   On review of the previous records Angel Munoz had labs drawn on 10/16/2023 which showed white blood cell 5.2, hemoglobin 9.3, MCV 85, and platelets 224.  Due to her history of persistent heavy menstrual bleeding and iron  deficiency anemia she was referred to hematology for further evaluation and management.  On exam today Angel Munoz reports that she previously lived and was treated in [place].  She reports that she is always had heavy menstrual cycles.  She reports that a few years ago she found that it was fibroids and they have steadily grown since that time.  She was treated at [facility] and given IV iron  there.  She reports she does have a lot of fatigue and tiredness and has unfortunately been losing some of her hair.  She also has severe pica and does eat cornstarch as well as flour.  She notes that she did it for years but has not started doing it recently.  She notes that this causes a deep decline in her quality of life.  She reports her menstrual cycles last from 3 to 7 days and on her heaviest days she goes to about 18 pads, though they are not soaked.  She reports that she often changes them because they overflow.  She reports that she continues to have some pica cravings but not as strong as they were previously.  She notes that she has tolerated IV iron  therapy well before in the past with no major side  effects or issues.  On further discussion she reports that anemia does run on her mom side of the family.  She reports that both her parents passed away due to homicide.  She has 3 aunts on her mom side who had breast cancer and 1 with ovarian cancer.  She also had a cousin with throat cancer and a maternal uncle with prostate cancer.  She has 2 healthy children.  She reports that she is a never smoker never drinker and currently works as a Corporate investment banker for hospitality/retail group.  She notes that she does not have any bleeding anywhere else such as nosebleeds, gum bleeding, or dark stools.  Otherwise she denies any lightheadedness, dizziness, or shortness of breath.  A full 10 point ROS is otherwise negative.  MEDICAL HISTORY:  Past Medical History:  Diagnosis Date   Anemia    Anxiety    Blood transfusion without reported diagnosis    Fibroid    Pica in adults     SURGICAL HISTORY: No past surgical history on file.  SOCIAL HISTORY: Social History   Socioeconomic History   Marital status: Married    Spouse name: Not on file   Number of children: Not on file   Years of education: Not on file   Highest education level: Not on file  Occupational History   Not on file  Tobacco Use   Smoking status: Never   Smokeless tobacco: Never  Substance and Sexual Activity  Alcohol use: Not Currently   Drug use: Not Currently   Sexual activity: Not Currently  Other Topics Concern   Not on file  Social History Narrative   Not on file   Social Drivers of Health   Financial Resource Strain: Not on file  Food Insecurity: No Food Insecurity (10/05/2020)   Received from [facility]   Hunger Vital Sign    Within the past 12 months, you worried that your food would run out before you got the money to buy more.: Never true    Within the past 12 months, the food you bought just didn't last and you didn't have money to get more.: Never true  Transportation Needs: Not on file  Physical Activity:  Not on file  Stress: Not on file  Social Connections: Unknown (11/23/2021)   Received from [facility]   Social Network    Social Network: Not on file  Intimate Partner Violence: Unknown (10/15/2021)   Received from [facility]   HITS    Physically Hurt: Not on file    Insult or Talk Down To: Not on file    Threaten Physical Harm: Not on file    Scream or Curse: Not on file    FAMILY HISTORY: No family history on file.  ALLERGIES:  is allergic to iron  dextran.  MEDICATIONS:  Current Outpatient Medications  Medication Sig Dispense Refill   albuterol (VENTOLIN HFA) 108 (90 Base) MCG/ACT inhaler Inhale 1 puff into the lungs every 6 (six) hours as needed for wheezing or shortness of breath.      benzonatate  (TESSALON ) 100 MG capsule Take 1 capsule (100 mg total) by mouth every 8 (eight) hours. 21 capsule 0   dicyclomine  (BENTYL ) 20 MG tablet Take 1 tablet (20 mg total) by mouth 3 (three) times daily before meals. 90 tablet 0   famotidine  (PEPCID ) 40 MG tablet TAKE 1 TABLET BY MOUTH TWICE A DAY 180 tablet 1   ferrous sulfate  325 (65 FE) MG tablet Take 1 tablet (325 mg total) by mouth daily. 30 tablet 0   hydrocortisone  (ANUSOL -HC) 25 MG suppository Place 1 suppository (25 mg total) rectally 2 (two) times daily. For 7 days 14 suppository 0   methocarbamol  (ROBAXIN ) 500 MG tablet Take 1 tablet (500 mg total) by mouth every 8 (eight) hours as needed for muscle spasms. 8 tablet 0   naproxen  (NAPROSYN ) 375 MG tablet Take 1 tablet (375 mg total) by mouth 2 (two) times daily with a meal. 20 tablet 0   omeprazole (PRILOSEC) 20 MG capsule Take 40 mg by mouth daily.     ondansetron  (ZOFRAN  ODT) 4 MG disintegrating tablet Take 1 tablet (4 mg total) by mouth every 8 (eight) hours as needed for nausea or vomiting. 20 tablet 0   ondansetron  (ZOFRAN -ODT) 4 MG disintegrating tablet Take 1 tablet (4 mg total) by mouth every 8 (eight) hours as needed. 20 tablet 0   oxyCODONE  (ROXICODONE ) 5 MG immediate  release tablet Take 1 tablet (5 mg total) by mouth every 6 (six) hours as needed for up to 10 doses for severe pain. 10 tablet 0   No current facility-administered medications for this visit.    REVIEW OF SYSTEMS:   Constitutional: ( - ) fevers, ( - )  chills , ( - ) night sweats Eyes: ( - ) blurriness of vision, ( - ) double vision, ( - ) watery eyes Ears, nose, mouth, throat, and face: ( - ) mucositis, ( - ) sore  throat Respiratory: ( - ) cough, ( - ) dyspnea, ( - ) wheezes Cardiovascular: ( - ) palpitation, ( - ) chest discomfort, ( - ) lower extremity swelling Gastrointestinal:  ( - ) nausea, ( - ) heartburn, ( - ) change in bowel habits Skin: ( - ) abnormal skin rashes Lymphatics: ( - ) new lymphadenopathy, ( - ) easy bruising Neurological: ( - ) numbness, ( - ) tingling, ( - ) new weaknesses Behavioral/Psych: ( - ) mood change, ( - ) new changes  All other systems were reviewed with the patient and are negative.  PHYSICAL EXAMINATION:  Vitals:   01/22/24 1321  BP: (!) 134/90  Pulse: 72  Resp: 14  Temp: 98.1 F (36.7 C)  SpO2: 99%   Filed Weights   01/22/24 1321  Weight: 247 lb 3.2 oz (112.1 kg)    GENERAL: well appearing middle-aged African-American female in NAD  SKIN: skin color, texture, turgor are normal, no rashes or significant lesions EYES: conjunctiva are pink and non-injected, sclera clear LUNGS: clear to auscultation and percussion with normal breathing effort HEART: regular rate & rhythm and no murmurs and no lower extremity edema Musculoskeletal: no cyanosis of digits and no clubbing  PSYCH: alert & oriented x 3, fluent speech NEURO: no focal motor/sensory deficits  LABORATORY DATA:  I have reviewed the data as listed    Latest Ref Rng & Units 10/16/2023   10:54 PM 06/19/2023   10:54 PM 05/17/2023    6:45 PM  CBC  WBC 4.0 - 10.5 K/uL 5.2  4.6  5.1   Hemoglobin 12.0 - 15.0 g/dL 88.6  9.5  8.2   Hematocrit 36.0 - 46.0 % 34.7  31.9  28.1   Platelets  150 - 400 K/uL 224  200  212        Latest Ref Rng & Units 10/16/2023   10:54 PM 06/19/2023   10:54 PM 05/17/2023    6:45 PM  CMP  Glucose 70 - 99 mg/dL 93  88  91   BUN 6 - 20 mg/dL 17  18  12    Creatinine 0.44 - 1.00 mg/dL 9.22  9.08  9.24   Sodium 135 - 145 mmol/L 135  133  139   Potassium 3.5 - 5.1 mmol/L 3.6  3.5  3.2   Chloride 98 - 111 mmol/L 105  104  106   CO2 22 - 32 mmol/L 23  23  24    Calcium 8.9 - 10.3 mg/dL 8.7  9.1  8.6   Total Protein 6.5 - 8.1 g/dL 7.7  7.3    Total Bilirubin 0.0 - 1.2 mg/dL 0.3  0.6    Alkaline Phos 38 - 126 U/L 78  72    AST 15 - 41 U/L 20  16    ALT 0 - 44 U/L 18  14       ASSESSMENT & PLAN Angel Munoz 48 y.o. female with medical history significant for fibroids who presents for evaluation of iron  deficiency anemia.   After review of the labs, review of the records, and discussion with the patient the patients findings are most consistent with iron  deficiency anemia in the setting of GYN bleeding.  She is currently following with OB/GYN with consideration of an upcoming fibroidectomy.   # Iron  Deficiency Anemia 2/2 to GYN Bleeding -- Findings are consistent with iron  deficiency anemia secondary to patient's menorrhagia --Encouraged her to follow-up with OB/GYN for better control of her menstrual cycles --We  will confirm iron  deficiency anemia by ordering iron  panel and ferritin as well as reticulocytes, CBC, and CMP --Continue ferrous sulfate  325 mg daily with a source of vitamin C --We will plan to proceed with IV iron  therapy in order to help bolster the patient's blood counts --Plan for return to clinic in 4 to 6 weeks time after last dose of IV iron    No orders of the defined types were placed in this encounter.   All questions were answered. The patient knows to call the clinic with any problems, questions or concerns.  A total of more than 60 minutes were spent on this encounter with face-to-face time and non-face-to-face  time, including preparing to see the patient, ordering tests and/or medications, counseling the patient and coordination of care as outlined above.   Norleen IVAR Kidney, MD Department of Hematology/Oncology [facility] at [facility] Phone: [phone] Pager: [phone] Email: [email or web address]  01/22/2024 1:24 PM

## 2024-01-23 ENCOUNTER — Encounter: Payer: Self-pay | Admitting: Obstetrics and Gynecology

## 2024-01-25 ENCOUNTER — Ambulatory Visit: Payer: Self-pay | Admitting: *Deleted

## 2024-01-25 NOTE — Telephone Encounter (Signed)
-----   Message from Norleen ONEIDA Kidney IV sent at 01/23/2024  7:23 PM EDT ----- Please let Ms. Codd know that her blood work does not fact show severe iron  deficiency.  Please recommend that she follow through with IV iron  therapy as currently recommended by OB/GYN.  They  have scheduled her for Venofer .  We will plan to see her back approximately 6 to 8 weeks after her last dose of IV iron  in order to assure her levels are correcting appropriately. ----- Message ----- From: Rebecka, Lab In [place] Sent: 01/22/2024   2:31 PM EDT To: Norleen ONEIDA Kidney [place], MD

## 2024-01-25 NOTE — Telephone Encounter (Signed)
 TCT patient regarding her recent lab results. Spoke with her. Advised that she has severe iron  deficiency and will benefit from IV iron . Advised that she can have her IV iron  as reccommended by her OB/GYN provider. Pt states that Dr. Federico told her she might be able to get the one dose Monoferric. Advised that I will ask him that in the morning and then that ordewr will be sent to the [facility] infusion center. They will get the authorization from  her insurance company. If her insurance does not cover this, Dr. Federico will have to go back to the Venofer  200 mg x 5 doses.Pt vocied understanding.

## 2024-01-26 ENCOUNTER — Other Ambulatory Visit: Payer: Self-pay | Admitting: Hematology and Oncology

## 2024-01-29 ENCOUNTER — Encounter: Payer: Self-pay | Admitting: Hematology and Oncology

## 2024-01-29 ENCOUNTER — Ambulatory Visit: Payer: Self-pay

## 2024-01-29 NOTE — Telephone Encounter (Signed)
 Called CAL then returned patient's call to inform that CAL stated patient needs to cal [phone] [facility] to schedule appt: pt states she never has to do that - states she always calls us .  Pt stated she will call number to schedule an appt and if the number was not correct - I asked the patient to call back and let us  know then we could go from there: patient verbalized understanding.

## 2024-01-29 NOTE — Telephone Encounter (Signed)
 FYI Only or Action Required?: Action required by provider: request for appointment.: iron  infusion  Patient was last seen in primary care on n/a.  Called Nurse Triage reporting Fatigue.  Symptoms began x last few days.  Interventions attempted: Nothing.  Symptoms are: unchanged.  Triage Disposition: See HCP Within 4 Hours (Or PCP Triage)  Patient/caregiver understands and will follow disposition?: No, refuses disposition    Copied from CRM [id number]. Topic: Clinical - Red Word Triage >> Jan 29, 2024  2:47 PM Charlet HERO wrote: Red Word that prompted transfer to Nurse Triage: Patient is calling stating that she has trns fusion scheduled but she is feeling extremly fatigued and her iron  levels are very low. Reason for Disposition  [1] MODERATE weakness (e.g., interferes with work, school, normal activities) AND [2] cause unknown  (Exceptions: Weakness from acute minor illness or poor fluid intake; weakness is chronic and not worse.)    Referred pt to ED: patient refused stating her Cancer doctor has put in orders.  Called [facility] and spoke staff stating pt is to call [phone] [facility] because that is where she will be receiving treatment  Answer Assessment - Initial Assessment Questions 1. DESCRIPTION: Tell me more about how you (your loved one; patient) are feeling. (e.g., tired, exhausted, lacks energy, feels physically weak)      Fatigued 2. SEVERITY: How bad is it?  How is the weakness or fatigue affecting your ability to do your usual activities?      severe 3. ONSET:  When did these symptoms begin? (e.g., hours, days, weeks, months)     Few days 4. CAUSE: What do you think is causing the weakness or fatigue? (e.g., poor appetite, not drinking enough fluids, trouble sleeping, a new medicine)     Iron  low  5. OTHER SYMPTOMS: Do you have any other symptoms? (e.g., bleeding, chest pain, diarrhea, fever, cough, SOB, vomiting)     Generalized fatigue, low  energy 6. NEW MEDICINES:  Have you started on any new medicines recently? (e.g., opioid pain medicines, benzodiazepines, muscle relaxants, antidepressants, antihistamines, neuroleptics, beta blockers)     Na 7. TREATMENT: Are you taking any medicines to treat weakness or fatigue?  (e.g., corticosteroids, stimulants, nutritional supplements, alternative therapies)     na 8. CALLER's COPING: How are you doing? How are other family members and loved ones doing?     N/a  Protocols used: Weakness (Generalized) and Fatigue-A-AH

## 2024-01-30 ENCOUNTER — Telehealth: Payer: Self-pay

## 2024-01-30 NOTE — Telephone Encounter (Signed)
 Dr. Federico, patient will be scheduled as soon as possible.  Rocky Doffing and Donny, please schedule the patient as soon as possible, she is feeling poorly. Thank you!  Patient is receiving Advance Medication - Supplied Externally. Medication: Monoferric  Manufacture: [employer] Approval Dates: Approved from 01/30/2024 until 01/28/2025. ID: [id number] Reason: Self Pay

## 2024-02-01 ENCOUNTER — Ambulatory Visit: Payer: Self-pay

## 2024-02-01 VITALS — BP 128/80 | HR 77 | Temp 98.0°F | Resp 18 | Ht 65.0 in | Wt 239.0 lb

## 2024-02-01 DIAGNOSIS — D500 Iron deficiency anemia secondary to blood loss (chronic): Secondary | ICD-10-CM

## 2024-02-01 DIAGNOSIS — N920 Excessive and frequent menstruation with regular cycle: Secondary | ICD-10-CM

## 2024-02-01 DIAGNOSIS — D509 Iron deficiency anemia, unspecified: Secondary | ICD-10-CM

## 2024-02-01 MED ORDER — SODIUM CHLORIDE 0.9 % IV SOLN
1000.0000 mg | Freq: Once | INTRAVENOUS | Status: AC
  Administered 2024-02-01: 1000 mg via INTRAVENOUS
  Filled 2024-02-01: qty 10

## 2024-02-01 MED ORDER — DIPHENHYDRAMINE HCL 25 MG PO CAPS: 50.0000 mg | ORAL_CAPSULE | ORAL | Status: DC | PRN

## 2024-02-01 MED ORDER — ACETAMINOPHEN 325 MG PO TABS: 650.0000 mg | ORAL_TABLET | Freq: Once | ORAL | Status: DC

## 2024-02-01 NOTE — Progress Notes (Addendum)
   ProcedureDiagnosis: Iron  Deficiency Anemia  Provider:  Mannam, Praveen MD  Procedure: IV Infusion  IV Type: Peripheral, IV Location: R Hand  Monoferric  (Ferric Derisomaltose ), Dose: 1000 mg  Infusion Start Time: 1109  Infusion Stop Time: 1132  Post Infusion IV Care: Observation period completed  Discharge: Condition: Good, Destination: Home . AVS Declined   Performed by:  Joyleen Haselton E, RN
